# Patient Record
Sex: Male | Born: 1935 | Race: White | Hispanic: No | Smoking: Former smoker
Health system: Southern US, Community
[De-identification: ages and names within clinical notes are randomized; demographics above are authoritative.]

## PROBLEM LIST (undated history)

## (undated) DIAGNOSIS — E785 Hyperlipidemia, unspecified: Secondary | ICD-10-CM

## (undated) DIAGNOSIS — I5022 Chronic systolic (congestive) heart failure: Secondary | ICD-10-CM

## (undated) DIAGNOSIS — I2510 Atherosclerotic heart disease of native coronary artery without angina pectoris: Secondary | ICD-10-CM

## (undated) DIAGNOSIS — I10 Essential (primary) hypertension: Secondary | ICD-10-CM

## (undated) DIAGNOSIS — Z862 Personal history of diseases of the blood and blood-forming organs and certain disorders involving the immune mechanism: Secondary | ICD-10-CM

## (undated) DIAGNOSIS — F0390 Unspecified dementia without behavioral disturbance: Secondary | ICD-10-CM

## (undated) DIAGNOSIS — G4733 Obstructive sleep apnea (adult) (pediatric): Secondary | ICD-10-CM

## (undated) DIAGNOSIS — N400 Enlarged prostate without lower urinary tract symptoms: Secondary | ICD-10-CM

## (undated) HISTORY — DX: Hyperlipidemia, unspecified: E78.5

## (undated) HISTORY — PX: INSERT / REPLACE / REMOVE PACEMAKER: SUR710

## (undated) HISTORY — PX: REFRACTIVE SURGERY: SHX103

## (undated) HISTORY — DX: Atherosclerotic heart disease of native coronary artery without angina pectoris: I25.10

## (undated) HISTORY — DX: Obstructive sleep apnea (adult) (pediatric): G47.33

## (undated) HISTORY — PX: HEMORRHOID SURGERY: SHX153

## (undated) HISTORY — DX: Essential (primary) hypertension: I10

## (undated) HISTORY — DX: Benign prostatic hyperplasia without lower urinary tract symptoms: N40.0

## (undated) HISTORY — DX: Personal history of diseases of the blood and blood-forming organs and certain disorders involving the immune mechanism: Z86.2

---

## 2004-01-09 ENCOUNTER — Other Ambulatory Visit: Payer: Self-pay

## 2005-11-16 ENCOUNTER — Inpatient Hospital Stay: Payer: Self-pay | Admitting: Internal Medicine

## 2005-11-16 ENCOUNTER — Other Ambulatory Visit: Payer: Self-pay

## 2005-12-01 ENCOUNTER — Ambulatory Visit: Payer: Self-pay | Admitting: Internal Medicine

## 2006-01-25 ENCOUNTER — Other Ambulatory Visit: Payer: Self-pay

## 2006-01-25 ENCOUNTER — Inpatient Hospital Stay: Payer: Self-pay | Admitting: Internal Medicine

## 2006-01-27 ENCOUNTER — Other Ambulatory Visit: Payer: Self-pay

## 2006-01-28 ENCOUNTER — Other Ambulatory Visit: Payer: Self-pay

## 2006-01-30 ENCOUNTER — Other Ambulatory Visit: Payer: Self-pay

## 2006-10-12 ENCOUNTER — Ambulatory Visit: Payer: Self-pay | Admitting: Gastroenterology

## 2006-11-20 HISTORY — PX: CORONARY ARTERY BYPASS GRAFT: SHX141

## 2007-04-04 ENCOUNTER — Inpatient Hospital Stay: Payer: Self-pay | Admitting: Internal Medicine

## 2007-04-04 ENCOUNTER — Other Ambulatory Visit: Payer: Self-pay

## 2007-04-05 ENCOUNTER — Other Ambulatory Visit: Payer: Self-pay

## 2007-05-15 ENCOUNTER — Ambulatory Visit: Payer: Self-pay | Admitting: *Deleted

## 2007-05-18 ENCOUNTER — Inpatient Hospital Stay: Payer: Self-pay | Admitting: Internal Medicine

## 2007-05-18 ENCOUNTER — Other Ambulatory Visit: Payer: Self-pay

## 2008-11-10 ENCOUNTER — Emergency Department: Payer: Self-pay | Admitting: Emergency Medicine

## 2009-06-15 ENCOUNTER — Inpatient Hospital Stay: Payer: Self-pay | Admitting: *Deleted

## 2010-09-28 ENCOUNTER — Inpatient Hospital Stay: Payer: Self-pay | Admitting: Internal Medicine

## 2010-10-20 ENCOUNTER — Ambulatory Visit: Payer: Self-pay | Admitting: Family

## 2010-12-22 ENCOUNTER — Ambulatory Visit: Payer: Self-pay | Admitting: Family

## 2011-02-04 ENCOUNTER — Emergency Department: Payer: Self-pay | Admitting: Emergency Medicine

## 2011-02-15 ENCOUNTER — Observation Stay: Payer: Self-pay | Admitting: Internal Medicine

## 2011-03-01 ENCOUNTER — Encounter: Payer: Self-pay | Admitting: Cardiovascular Disease

## 2011-03-01 ENCOUNTER — Observation Stay: Payer: Self-pay | Admitting: Specialist

## 2011-03-02 ENCOUNTER — Inpatient Hospital Stay (HOSPITAL_COMMUNITY)
Admission: AD | Admit: 2011-03-02 | Discharge: 2011-03-14 | DRG: 472 | Disposition: A | Discharge status: Home Health | Payer: Medicare Other | Source: Other Acute Inpatient Hospital | Attending: Internal Medicine | Admitting: Internal Medicine

## 2011-03-02 DIAGNOSIS — G4733 Obstructive sleep apnea (adult) (pediatric): Secondary | ICD-10-CM | POA: Diagnosis present

## 2011-03-02 DIAGNOSIS — Z860100 Personal history of colon polyps, unspecified: Secondary | ICD-10-CM

## 2011-03-02 DIAGNOSIS — I509 Heart failure, unspecified: Secondary | ICD-10-CM | POA: Diagnosis present

## 2011-03-02 DIAGNOSIS — Z880 Allergy status to penicillin: Secondary | ICD-10-CM

## 2011-03-02 DIAGNOSIS — F19921 Other psychoactive substance use, unspecified with intoxication with delirium: Secondary | ICD-10-CM | POA: Diagnosis not present

## 2011-03-02 DIAGNOSIS — Z889 Allergy status to unspecified drugs, medicaments and biological substances status: Secondary | ICD-10-CM

## 2011-03-02 DIAGNOSIS — D509 Iron deficiency anemia, unspecified: Secondary | ICD-10-CM | POA: Diagnosis present

## 2011-03-02 DIAGNOSIS — T4275XA Adverse effect of unspecified antiepileptic and sedative-hypnotic drugs, initial encounter: Secondary | ICD-10-CM | POA: Diagnosis not present

## 2011-03-02 DIAGNOSIS — I2510 Atherosclerotic heart disease of native coronary artery without angina pectoris: Secondary | ICD-10-CM | POA: Diagnosis present

## 2011-03-02 DIAGNOSIS — M4712 Other spondylosis with myelopathy, cervical region: Secondary | ICD-10-CM | POA: Diagnosis present

## 2011-03-02 DIAGNOSIS — T380X5A Adverse effect of glucocorticoids and synthetic analogues, initial encounter: Secondary | ICD-10-CM | POA: Diagnosis not present

## 2011-03-02 DIAGNOSIS — Z9181 History of falling: Secondary | ICD-10-CM

## 2011-03-02 DIAGNOSIS — I10 Essential (primary) hypertension: Secondary | ICD-10-CM | POA: Diagnosis present

## 2011-03-02 DIAGNOSIS — Z951 Presence of aortocoronary bypass graft: Secondary | ICD-10-CM

## 2011-03-02 DIAGNOSIS — N400 Enlarged prostate without lower urinary tract symptoms: Secondary | ICD-10-CM | POA: Diagnosis present

## 2011-03-02 DIAGNOSIS — Z950 Presence of cardiac pacemaker: Secondary | ICD-10-CM

## 2011-03-02 DIAGNOSIS — I5042 Chronic combined systolic (congestive) and diastolic (congestive) heart failure: Secondary | ICD-10-CM | POA: Diagnosis present

## 2011-03-02 DIAGNOSIS — E785 Hyperlipidemia, unspecified: Secondary | ICD-10-CM | POA: Diagnosis present

## 2011-03-02 DIAGNOSIS — M5000 Cervical disc disorder with myelopathy, unspecified cervical region: Principal | ICD-10-CM | POA: Diagnosis present

## 2011-03-02 DIAGNOSIS — M109 Gout, unspecified: Secondary | ICD-10-CM | POA: Diagnosis present

## 2011-03-02 DIAGNOSIS — Z87891 Personal history of nicotine dependence: Secondary | ICD-10-CM

## 2011-03-02 DIAGNOSIS — Z7982 Long term (current) use of aspirin: Secondary | ICD-10-CM

## 2011-03-03 ENCOUNTER — Inpatient Hospital Stay (HOSPITAL_COMMUNITY): Payer: Medicare Other

## 2011-03-03 LAB — COMPREHENSIVE METABOLIC PANEL
ALT: 20 U/L (ref 0–53)
AST: 32 U/L (ref 0–37)
Alkaline Phosphatase: 102 U/L (ref 39–117)
CO2: 25 mEq/L (ref 19–32)
Chloride: 107 mEq/L (ref 96–112)
GFR calc Af Amer: >60 mL/min (ref >60)
GFR calc non Af Amer: >60 mL/min (ref >60)
Potassium: 3.9 mEq/L (ref 3.5–5.1)
Sodium: 140 mEq/L (ref 135–145)
Total Bilirubin: 1.3 mg/dL — ABNORMAL HIGH (ref 0.3–1.2)

## 2011-03-04 LAB — DIFFERENTIAL
Eosinophils Absolute: 0.1 10*3/uL (ref 0.0–0.7)
Eosinophils Relative: 2 % (ref 0–5)
Lymphs Abs: 1.1 10*3/uL (ref 0.7–4.0)
Monocytes Absolute: 1.2 10*3/uL — ABNORMAL HIGH (ref 0.1–1.0)
Monocytes Relative: 19 % — ABNORMAL HIGH (ref 3–12)

## 2011-03-04 LAB — CBC
HCT: 43.5 % (ref 39.0–52.0)
MCH: 30 pg (ref 26.0–34.0)
MCV: 89.5 fL (ref 78.0–100.0)
Platelets: 141 10*3/uL — ABNORMAL LOW (ref 150–400)
RDW: 15.7 % — ABNORMAL HIGH (ref 11.5–15.5)

## 2011-03-04 LAB — BASIC METABOLIC PANEL
BUN: 19 mg/dL (ref 6–23)
Creatinine, Ser: 1.13 mg/dL (ref 0.4–1.5)
GFR calc non Af Amer: >60 mL/min (ref >60)
Glucose, Bld: 99 mg/dL (ref 70–99)

## 2011-03-06 ENCOUNTER — Inpatient Hospital Stay (HOSPITAL_COMMUNITY): Payer: Medicare Other

## 2011-03-06 LAB — BASIC METABOLIC PANEL
BUN: 22 mg/dL (ref 6–23)
CO2: 26 mEq/L (ref 19–32)
Calcium: 9 mg/dL (ref 8.4–10.5)
Creatinine, Ser: 1.26 mg/dL (ref 0.4–1.5)
GFR calc Af Amer: >60 mL/min (ref >60)

## 2011-03-06 MED ORDER — IOHEXOL 300 MG/ML  SOLN
10.0000 mL | Freq: Once | INTRAMUSCULAR | Status: AC | PRN
  Administered 2011-03-06: 10 mL via INTRATHECAL

## 2011-03-07 ENCOUNTER — Inpatient Hospital Stay (HOSPITAL_COMMUNITY): Payer: Medicare Other

## 2011-03-07 LAB — PROTIME-INR: Prothrombin Time: 14.1 seconds (ref 11.6–15.2)

## 2011-03-07 LAB — BASIC METABOLIC PANEL
BUN: 30 mg/dL — ABNORMAL HIGH (ref 6–23)
CO2: 25 mEq/L (ref 19–32)
Calcium: 9.2 mg/dL (ref 8.4–10.5)
Creatinine, Ser: 1.25 mg/dL (ref 0.4–1.5)
GFR calc non Af Amer: 56 mL/min — ABNORMAL LOW (ref >60)
Glucose, Bld: 105 mg/dL — ABNORMAL HIGH (ref 70–99)
Sodium: 138 mEq/L (ref 135–145)

## 2011-03-07 LAB — CBC
MCH: 30.4 pg (ref 26.0–34.0)
Platelets: 172 10*3/uL (ref 150–400)
RBC: 5.23 MIL/uL (ref 4.22–5.81)
RDW: 15.4 % (ref 11.5–15.5)

## 2011-03-08 ENCOUNTER — Inpatient Hospital Stay (HOSPITAL_COMMUNITY): Payer: Medicare Other

## 2011-03-08 LAB — CBC
HCT: 45.3 % (ref 39.0–52.0)
MCH: 29.9 pg (ref 26.0–34.0)
MCHC: 33.8 g/dL (ref 30.0–36.0)
MCV: 88.5 fL (ref 78.0–100.0)
Platelets: 169 10*3/uL (ref 150–400)
RDW: 15.3 % (ref 11.5–15.5)
WBC: 8.2 10*3/uL (ref 4.0–10.5)

## 2011-03-08 LAB — BASIC METABOLIC PANEL
BUN: 31 mg/dL — ABNORMAL HIGH (ref 6–23)
Calcium: 9.1 mg/dL (ref 8.4–10.5)
Creatinine, Ser: 1.14 mg/dL (ref 0.4–1.5)
GFR calc non Af Amer: >60 mL/min (ref >60)
Glucose, Bld: 100 mg/dL — ABNORMAL HIGH (ref 70–99)

## 2011-03-09 DIAGNOSIS — M4712 Other spondylosis with myelopathy, cervical region: Secondary | ICD-10-CM

## 2011-03-10 LAB — BASIC METABOLIC PANEL
CO2: 28 mEq/L (ref 19–32)
Calcium: 9.4 mg/dL (ref 8.4–10.5)
Creatinine, Ser: 1.11 mg/dL (ref 0.4–1.5)
GFR calc Af Amer: >60 mL/min (ref >60)
GFR calc non Af Amer: >60 mL/min (ref >60)

## 2011-03-10 LAB — CBC
MCH: 29.6 pg (ref 26.0–34.0)
MCHC: 33.6 g/dL (ref 30.0–36.0)
Platelets: 191 10*3/uL (ref 150–400)
RDW: 15.1 % (ref 11.5–15.5)

## 2011-03-12 ENCOUNTER — Inpatient Hospital Stay (HOSPITAL_COMMUNITY): Payer: Medicare Other

## 2011-03-13 LAB — BASIC METABOLIC PANEL
Calcium: 9.4 mg/dL (ref 8.4–10.5)
GFR calc Af Amer: >60 mL/min (ref >60)
GFR calc non Af Amer: >60 mL/min (ref >60)
Glucose, Bld: 107 mg/dL — ABNORMAL HIGH (ref 70–99)
Sodium: 131 mEq/L — ABNORMAL LOW (ref 135–145)

## 2011-03-13 NOTE — H&P (Signed)
NAME:  Gregory [name], Gregory [name] NO.:  [id number]  MEDICAL RECORD NO.:  [id number]           PATIENT TYPE:  I  LOCATION:  5501                         FACILITY:  [facility]  PHYSICIAN:  Mariea Stable, MD   DATE OF BIRTH:  03/06/1931  DATE OF ADMISSION:  03/02/2011 DATE OF DISCHARGE:                             HISTORY & PHYSICAL   PRIMARY CARE PHYSICIAN:  [facility].  CARDIOLOGIST:  Dr. Dorothyann Peng.  CHIEF COMPLAINT:  Difficulty walking with frequent falls and difficulty with balance.  HISTORY OF PRESENT ILLNESS:  Gregory [name] is a 77 year old right-handed man with past medical history significant for hypertension, hyperlipidemia, tobacco use, coronary artery disease status post CABG, and mild congestive heart failure who is transferred from [facility] for gait disturbance and frequent falls.  The patient was admitted to [facility] on February 14, 2011, through February 16, 2011, for of presyncope and falls, thought to be secondary to his beta-blocker use.  He was admitted again on March 01, 2011, again for difficulty walking with frequent falls and difficulty with balance.  Apparently, his symptoms have been worsening over the last 4-6 weeks.  His gait has become unstable to the point where he is now falling on a daily basis secondary to loss of balance with his legs giving out.  He also reports some numbness and tingling in bilateral hands as well as lower extremities.  Of note, he denies any chest pain, palpitation, orthopnea, bowel or bladder problems.  The patient has had an extensive workup including a 2-D echocardiogram, catheterization, carotid ultrasound, B12 and folate levels, TSH, and CT scan of the head which have all been negative.  A neurosurgical evaluation was requested and Dr. Kemper Durie did a consultation and recommended a transfer to [facility] as his impression was that of low cervical myelopathy and radiculopathy.  Recommendation for  cervical spine CT along with pyelogram to further evaluate the possible myelopathy.  The patient was then transferred to [facility] and admitted to the [facility] with neurosurgical consultation to follow.  PAST MEDICAL HISTORY: 1. Chronic systolic congestive heart failure with an EF of 45-50% and     diastolic dysfunction. 2. History of anemia secondary to multiple AVMs leading to iron     deficiency with EGD in 2011 showing gastritis and a colonoscopy     which had shown hemorrhoids and polyps. 3. Possible sleep apnea. 4. History of coronary artery disease status post CABG in 2008.  Most     recent catheterization in November 2011 showed stable coronary     artery disease with no evidence of new occlusions. 5. Hypertension. 6. Hyperlipidemia. 7. BPH. 8. History of syncope status post pacemaker placement.  PAST SURGICAL HISTORY: 1. CABG in 2008 with catheterization in November 2011 per above. 2. Pacemaker placement. 3. Laser eye surgery. 4. Hemorrhoidectomy.  SOCIAL HISTORY:  The patient lives alone in a trailer next to his son. The patient quit smoking apparently in 1989 but smoked for more than 30 years prior to that.  He apparently stopped drinking approximately 30 years ago and denies any drug use.  The patient  is widowed and states that either of his sons are to be contacted in case of need for medical decision making.  Sons' names are Arlys John and Genevie Cheshire and their cell phone numbers are [phone] and [phone] respectively.  FAMILY HISTORY:  Sister with coronary artery disease.  Mother had gastric cancer.  Brother had lung cancer.  MEDICATIONS: 1. Aspirin 81 mg p.o. daily. 2. Colace 100 mg p.o. b.i.d. 3. Magnesium oxide 400 mg p.o. daily. 4. Lovastatin 40 mg p.o. daily. 5. Senna 8.5 mg p.o. daily. 6. Iron sulfate 325 mg p.o. daily. 7. Lopressor 25 mg p.o. b.i.d. 8. Flomax 0.4 mg p.o. daily. 9. Tramadol 50 mg p.o. daily p.r.n. 10.Omeprazole 20 mg  p.o. daily. 11.Altace 10 mg p.o. b.i.d. 12.Lasix 20 mg p.o. every other day.  ALLERGIES:  PENICILLIN and CORTISONE which cause rash.  REVIEW OF SYSTEMS:  As per HPI.  Others are reviewed and negative.  PHYSICAL EXAMINATION:  VITAL SIGNS:  Temperature 97.9, blood pressure 122/73, pulse of 58, respirations 19, oxygen saturation 93% on room air. GENERAL:  This is an elderly man, lying in bed in no acute distress. HEENT:  Head is normocephalic, atraumatic.  Pupils equally round and reactive to light.  Extraocular movements are intact although there is a strabismus present.  Mucous membranes are moist.  There are no oral pharyngeal lesions. NECK:  Supple.  There is no JVD.  There is no thyromegaly.  There is no carotid bruits. LUNGS:  Good air movement bilaterally and are clear to auscultation. HEART:  Normal S1 and S2 with a regular rate and rhythm.  There is a grade 1-2/6 systolic murmur.  There are no gallops or rubs. ABDOMEN:  Positive bowel sounds, soft, nontender, nondistended. EXTREMITIES:  There is no edema. NEUROLOGIC:  The patient is awake, alert, and oriented x3.  Cranial nerves are grossly intact. Motor:  Approximately 4/5 in the left hand with 5-/5 at the right. There is approximately 4+/5 at the ankles bilaterally.  Sensation is grossly intact to light touch.  LABORATORY DATA:  WBC 15.2, hemoglobin 14.0, platelets 133.  Sodium 143, potassium 3.9, chloride 111, bicarb 22, BUN 24, creatinine 1.04, calcium 8.6.  LFTs are within normal limits.  TSH of 1.4.  B12 737.  Folic acid 7.5.  IMAGING: 1. CT head without contrast.  Impression:  Stable CT of the brain with     evidence of atrophy and chronic small vessel ischemic disease.     Stable dystrophic left frontal cortical calcifications are present. 2. Chest x-ray.  Impression:  Increased interstitial markings which     may represent a component of pulmonary vascular congestion. 3. CT cervical spine.  Impression: 4. No  evidence of compression fracture of the cervical spine. 5. There is a disk space narrowing and annular disk bulging at C6-C7     but no high-grade canal stenosis noted. 6. Variable degrees of mild neural foraminal encroachment at several     levels.  ASSESSMENT AND PLAN: 1. Abnormal gait with frequent falls and likely a low cervical     myelopathy with radiculopathy.  The patient will be admitted to a     med/surg floor.  We will go ahead and order routine labs for the     morning.  We will obtain a neurosurgical consultation from Dr.     Kemper Durie.  The patient is to have the CT of the cervical spine     reviewed along with possible cervical spine myelogram. 2. History of chronic  congestive heart failure, mixed systolic and     diastolic, with ejection fraction of 45-50%.  The patient is     currently euvolemic.  We will continue his home diuretics,     angiotensin-converting enzyme inhibitor, and beta-blocker. 3. History of coronary artery disease status post coronary artery     bypass graft.  The patient is currently asymptomatic.  We will     continue with his home dose of aspirin, beta-blocker, and statin. 4. Hypertension.  We will continue with home antihypertensive. 5. Hyperlipidemia.  Continue with statin therapy. 6. Benign prostatic hypertrophy.  We will continue with his alpha-     blocker at home dose.     Mariea Stable, MD     MA/MEDQ  D:  03/02/2011  T:  03/02/2011  Job:  [id number]  cc:   [facility] Dr. Dorothyann Peng  Electronically Signed by Mariea Stable MD on 03/13/2011 10:41:14 AM

## 2011-03-20 NOTE — Consult Note (Signed)
NAME:  Gregory [name], VITELLI NO.:  [id number]  MEDICAL RECORD NO.:  [id number]           PATIENT TYPE:  I  LOCATION:  5501                         FACILITY:  [facility]  PHYSICIAN:  Tia Alert, MD     DATE OF BIRTH:  03/06/1931  DATE OF CONSULTATION:  03/05/2011 DATE OF DISCHARGE:                                CONSULTATION   CHIEF COMPLAINT:  Difficulty walking, suspected myelopathy.  BRIEF HISTORY OF PRESENT ILLNESS:  [name] was transferred from an outside hospital to the Hospitalist Service regarding difficulty with gait.  He was seen by an outside neurologist who felt he probably had a cervical myelopathy.  He was transferred from [facility], there they had a CT scan of the cervical spine which suggested some changes at C6-7 but we do not have this films for our review.  He did report some numbness and weakness in his hands.  He reports difficulty with gait.  He has had multiple falls over the last 6 weeks, has become progressively worse over time.  CT myelogram of the cervical, thoracic and lumbar spine has been ordered but has not been done at this point.  PAST MEDICAL HISTORY: 1. CHF with expected EF of 45-50% with diastolic dysfunction. 2. Anemia. 3. Sleep apnea. 4. Coronary artery disease status post CABG. 5. Hypertension. 6. Dyslipidemia. 7. BPH. 8. Syncope. 9. Status post pacemaker placement. 10.Laser eye surgery. 11.Hemorrhoidectomy.  MEDICATIONS:  Aspirin, Colace, magnesium, lovastatin, senna, iron, Lopressor, Flomax, tramadol, Prilosec, Altace, and Lasix.  ALLERGIES:  PENICILLIN and CORTISONE which cause rash.  REVIEW OF SYSTEMS:  As above.  SOCIAL HISTORY:  Lives alone in a trailer next to his son and quit smoking apparently in 1989 but smoked for more than 30 years prior to this.  Stopped drinking about 30 years ago and denies any drug use.  PHYSICAL EXAMINATION:  VITAL SIGNS:  He is afebrile.  Blood  pressure 122/73, pulse 60, respirations 16. GENERAL:  Pleasant cooperative man sitting in a chair. HEENT:  Normocephalic, atraumatic.  Extraocular movements are decreased in the right eye with lateral gaze and there is some dystrophia. NECK:  Supple. HEART:  Regular rate and rhythm. EXTREMITIES:  No obvious deformities.  There are multiple healing abrasions on the knees bilaterally. NEUROLOGIC:  He is awake.  He is pleasant.  He is interactive.  There is no aphasia.  He has good attention span.  He is conversive.  No facial asymmetry.  His strength is 4-/5 in the hand grips, 4/5 in the upper extremities with some discoordination of movement.  He has a positive Hoffman sign bilaterally.  Reflexes are not overtly brisk.  Lower extremities seem to have good strength.  Gait is not tested at this point as he does not have an assisted device to help walk and I did not want him to suffer a fall.  Sensation appears to be grossly intact.  ASSESSMENT/PLAN:  Would like to get a CT myelogram of cervical, thoracic and lumbar spine to evaluate the cause of his difficulty with ambulation.  I certainly suspect that he is myelopathic but the report  of the CT scan of cervical spine does not appear to be overly impressive though myelogram has been ordered.  I think it will be done tomorrow.  I will make further treatment recommendations once the myelogram is done.     Tia Alert, MD     DSJ/MEDQ  D:  03/05/2011  T:  03/05/2011  Job:  [id number]  Electronically Signed by Marikay Alar MD on 03/20/2011 10:46:51 AM

## 2011-03-20 NOTE — Op Note (Signed)
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  PROCEDURES: 1. Partial corpectomy at C5 and C6 followed by decompressive anterior     cervical diskectomy C5-6 for central canal decompression. 2. Anterior cervical arthrodesis C5-6 utilizing a 12-mm PEEK interbody     cage packed with local autograft and Actifuse putty. 3. Anterior cervical plating C5-6 utilizing the DePuy spine plate.SURGEON:  Tia Alert, MD  ASSISTANT:  Donalee Citrin, MD  ANESTHESIA:  General endotracheal.  COMPLICATIONS:  None apparent.  INDICATIONS FOR PROCEDURE:  Mr. Seufert is a very pleasant 77 year old gentleman who was admitted with difficulty ambulation and weakness and numbness in his hands.  He was found to have myelopathy on exam.  He had a CT myelogram which showed a large cervical disk herniation at C5-6 with severe compression of the cervical cord.  I recommended ACDF with plating at C5-6.  He understood the risks, benefits, and expected outcome, and wished to proceed.  DESCRIPTION OF PROCEDURE:  The patient was taken to the operating room after induction of adequate generalized endotracheal anesthesia.  He was placed in the supine position on the operating table.  The glide scope was used to intubate him.  I was present for the intubation.  There was no significant neck extension during the intubation.  He was then positioned carefully.  His right  anterior cervical region was shaved, cleaned with Hibiclens, prepped with DuraPrep, and then draped in usual sterile fashion.  A 5 mL of local anesthesia was injected and a transverse incision was made to the right of midline and carried down to the platysma.  The placenta was elevated and opened and undermined with Metzenbaum scissors, and then dissected in plane medial to the sternocleidomastoid muscle and internal carotid artery and lateral to the trachea and esophagus to expose C5-6.  Intraoperative fluoroscopy confirmed my level and then the longus colli muscles were taken down and the shadow line retractors were placed under this to expose C5-6.  The annulus was incised and the initial diskectomy was done with pituitary rongeurs and curved curettes.  The disk was very degenerated.  I used the high-speed drill to drill the endplates down to the level of posterior longitudinal ligament.  I widened the disk space to a height of 12 mm.  Because of the size of the disk herniation, we pushed the posterior longitudinal ligament away from the vertebral bodies.  I knew I had to do a very generous decompression at this level; therefore, we felt that partial corpectomy was going to be in order.  Therefore, I drilled away significant amount of C5-C6  and I was able to then bring in the operating microscope, which we used for microscopic dissection in the remainder of the case.  The posterior longitudinal ligament was opened.  I then removed by removing a large midline fragment.  The dura then relaxed.  I was able to undercut C5 and C6.  The dura was no longer pushed severely away from this and seemed to be full all the way across. Once we undercut the vertebral bodies, we could pass the nerve hook easily circumferentially, so therefore by visualization and by palpation, we felt an adequate decompression of the canal.  We measured interspace to be 12 mm and used a corresponding PEEK interbody  cage packed with local autograft and Actifuse putty, and tapped this into position at C5-6.  We then used a DePuy spine plate and placed two 14-mm variable angle screws in the bodies of C5 and C6.  I then locked these into position.  We irrigated with saline solution containing bacitracin, dried all bleeding points with bipolar cautery and with Surgifoam, and once meticulous hemostasis was achieved, we closed the platysma with 3-0 Vicryl closing the subcuticular tissue with 0 Vicryl and closed the skin with Benzoin and Steri-Strips.  The drapes were removed.  Sterile dressing was applied.  The patient was awakened from general anesthesia and transferred to recovery room in stable condition.  At the end of the procedure, all sponge, needle, and instrument counts were correct.     Tia Alert, MD     DSJ/MEDQ  D:  03/08/2011  T:  03/09/2011  Job:  [id number]  Electronically Signed by Marikay Alar MD on 03/20/2011 10:46:56 AM

## 2011-03-20 NOTE — Consult Note (Signed)
  NAME:  Gregory, [name] NO.:  [id number]  MEDICAL RECORD NO.:  [id number]           PATIENT TYPE:  I  LOCATION:  5501                         FACILITY:  [facility]  PHYSICIAN:  Tia Alert, MD     DATE OF BIRTH:  03/06/1931  DATE OF CONSULTATION:  03/07/2011 DATE OF DISCHARGE:                                CONSULTATION   Gregory [name] had his CT myelogram done yesterday and this shows multilevel spondylosis, but the most significant finding is that of a large central disk herniation at C5-6 with severe compression of the cervical spinal cord at that level, left greater than right.  Gregory [name] continues to complain of difficulty with gait, he complains of cramping in his left lower extremity and some dysfunction and numbness in his hands.  Physical exam is unchanged except today he has more cramping in his leg during my talk with him.  I explained the findings on the myelogram to him.  I think this is the cause of his cervical myelopathy and what I have recommended is a decompressive surgery in the form of an anterior cervical diskectomy fusion and plating at C5-6.  I have tried to describe this surgery to him as best I could in detail.  I have described typical outcomes and recovery times.  He understands that the goal of this surgery is not necessarily to make him better, but to stop progression of myelopathy; however, I did explain to him that a certain number of people do get some improvement from the surgery in their gait and the use of their hands.  He will likely need some type of rehab after the surgery to work on his gait because he has had trouble with his gait for about 3 months.  He understands the risks of surgery to include, but limits to bleeding, infection, nerve root injury, spinal cord injury, CSF leak, numbness, weakness, paralysis, pseudoarthrosis, hardware failure, esophageal injury, tracheal injury, carotid artery injury, vertebral artery  injury, recurrent laryngeal nerve injury, lack of relief of pain, worsening pain, worsening neurologic deficits, and anesthesia risks.  He understands because of the critical severity of his spinal stenosis and the cord compression that the risk of spinal cord injury with this surgery is higher than the typical ACDF.  He understands all of these things and states, "I have to do something" and he is willing to move forward with the surgery and we will try to do this tomorrow, so he will be made n.p.o. after midnight, we are getting consented for the surgery.     Tia Alert, MD     DSJ/MEDQ  D:  03/07/2011  T:  03/07/2011  Job:  [id number]  Electronically Signed by Marikay Alar MD on 03/20/2011 10:46:54 AM

## 2011-03-29 NOTE — Discharge Summary (Signed)
NAME:  Gregory [name], Gregory [name] NO.:  [id number]  MEDICAL RECORD NO.:  [id number]           PATIENT TYPE:  I  LOCATION:  3029                         FACILITY:  [facility]  PHYSICIAN:  Calvert Cantor, M.D.     DATE OF BIRTH:  03/06/1931  DATE OF ADMISSION:  03/02/2011 DATE OF DISCHARGE:  03/14/2011                              DISCHARGE SUMMARY   PRIMARY CARE PHYSICIAN:  At the [facility].  CARDIOLOGIST:  Dr. Dorothyann Peng  PRESENTING COMPLAINT:  Difficulty walking with frequent falls and numbness and tingling of upper extremities.  DISCHARGE DIAGNOSES: 1. 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 2. Delirium during hospital stay that is secondary to steroids or     narcotics. 3. Gout of left knee occurring during hospital stay.  PAST MEDICAL HISTORY: 1. Chronic systolic heart failure with an EF of 45-50% and chronic     diastolic heart failure. 2. Anemia secondary to multiple AVMs leading to iron deficiency. 3. Possible sleep apnea. 4. Coronary artery disease status post CABG in 2008, most recent cath     in November 2011 revealed stable coronary artery disease with no     new occlusions. 5. Hypertension. 6. Hyperlipidemia. 7. BPH. 8. History of syncope status post permanent pacemaker.  DISCHARGE MEDICATIONS:  New medication is, 1. Allopurinol 100 mg daily. 2. Ibuprofen 600 mg every 8 hours for gout and moderate pain.  Continue the following home meds, 1. Altace 10 mg daily. 2. Aspirin 81 mg daily. 3. Ferrous sulfate over-the-counter 1 tablet daily. 4. Klor-Con 20 mEq daily. 5. Lasix 40 mg 1-1/2 tablets daily. 6. Lipitor 40 mg 1 tablet daily. 7. Magnesium oxide 1 tablet daily. 8. Metoprolol tartrate 25 mg 1 tablet twice a day. 9. Nitroglycerin translingual spray every 5 minutes as needed up to 3     doses. 10.Prilosec 20 mg daily. 11.Senna OTC 1 tablet daily as needed for  constipation. 12.Tamsulosin 0.4 mg daily. 13.Tramadol 50 mg every 8 hours as needed for pain.  CONSULTANTS:  Tia Alert, MD, Neurosurgery.  RADIOLOGICAL IMAGING DATA: 1. Cervical myelogram performed on March 06, 2011, revealed high-grade     stenosis at C5-C6 with a probable large disk protrusion at this     level, mild disk bulging at L3-L4 and L4-L5. 2. CT C-spine with contrast on March 06, 2011, revealed multilevel     disk and facet degeneration most significant finding is at extruded     disk fragment of C5-C6 central and left-sided with compression of     the spinal cord. 3. CT lumbar spine with contrast on March 06, 2011, revealed lumbar     disk and facet generation with mild spinal stenosis at L3-L4 and L4-     L5.  No focal disk protrusion is identified. 4. Chest x-ray two-view on March 07, 2011, revealed mild chronic     interstitial lung disease/chronic bronchitis, suspect scarring at     left base and cardiomegaly. 5. C-spine one-view on March 08, 2011, revealed C5-C6 anterior     cervical plate. 6.  X-ray of the left hip complete on March 08, 2011, was negative for     fracture.  There was soft tissue calcification compatible with     calcified tendonitis. 7. X-ray of the left knee complete on March 12, 2011, revealed soft     tissue swelling anterior to the patella compatible with bursitis. 8. X-ray of the tibia-fibula on March 12, 2011, did not reveal any     acute findings.  HOSPITAL COURSE:  This is a 77 year old male who was admitted with complaints of frequent falls and also noted to have numbness and tingling in bilateral upper extremities.  Above-mentioned studies were done, which revealed cord compression.  Neurosurgical consult was requested.  Dr. Yetta Barre took him to the OR and performed surgery on March 08, 2011.  PROCEDURES:  Are as follows, 1. Partial corpectomy at C5 and C6 followed by decompressive anterior     cervical diskectomy at C5-C6 for  central canal decompression 2. Anterior cervical arthrodesis, C5-C6 utilizing a 12-mm peak     interbody cage packed with local autograft and Actifuse putty. 3. Anterior cervical plating, C5-C6 utilizing the DePuy spine plate.  No significant complications were noted after the surgery.  The patient immediately regained sensation in both arms.  He did have some residual left leg weakness and was initially recommended to go to rehab facility.  However, soon after that he developed redness and swelling of the left knee, which was consistent with gout.  He was then treated with colchicine relieving his gout and his pain in the knee.  Today, his strength in his lower extremities is 4/5 bilaterally.  The patient has declined a rehab facility.  At this point, it appears that he is safe for home health PT and OT, in addition a social work consult will also be requested to ensure that home safety is satisfactory.  The patient had an episode of delirium soon after surgery.  This lasted 2-3 days.  He did require a sitter at the time.  He does not have any underlying dementia and according to the sons, he has never had an episode of delirium in the past.  At that time, he was on prednisone, Flexeril, and Vicodin, all 3 of which were discontinued immediately with improvement noted by day 3.  At this point, the patient is back to his baseline mental status.  PHYSICAL EXAMINATION:  On physical exam today, LUNGS:  Clear bilaterally. HEART:  Regular rate and rhythm.  No murmurs. ABDOMEN:  Soft, nontender, nondistended.  Bowel sounds positive.  No organomegaly. EXTREMITIES:  No cyanosis, clubbing, or edema.  CONDITION ON DISCHARGE:  Stable.  FOLLOWUP INSTRUCTIONS:  He does need to follow up with Dr. Yetta Barre.  In addition, he is recommended to follow up with the [facility] in 2-3 weeks.  TIME ON DISCHARGE TODAY:  60 minutes.     Calvert Cantor, M.D.     SR/MEDQ  D:  03/14/2011  T:   03/14/2011  Job:  [id number]  cc:   Tia Alert, MD [facility]  Electronically Signed by Calvert Cantor M.D. on 03/29/2011 11:11:25 PM

## 2011-03-29 NOTE — Group Therapy Note (Signed)
NAME:  Gregory, [name] NO.:  [id number]  MEDICAL RECORD NO.:  [id number]           PATIENT TYPE:  I  LOCATION:  5501                         FACILITY:  [facility]  PHYSICIAN:  Ramiro Harvest, MD    DATE OF BIRTH:  03/06/1931                                PROGRESS NOTE   CURRENT DIAGNOSES: 1. Abnormal gait and frequent falls likely secondary to cervical     myelopathy with radiculopathy. 2. History of systolic and diastolic heart failure compensated. 3. Hypertension, stable. 4. Coronary artery disease status post coronary artery bypass     grafting, stable. 5. Benign prostatic hypertrophy, stable. 6. History of anemia secondary to multiple arteriovenous malformations     leading to iron deficiency with EGD in 2011 showing gastritis and     colonoscopy, which had shown hemorrhoids and polyps. 7. Probable sleep apnea. 8. History of coronary artery disease status post coronary artery     bypass grafting in 2008.  Most recent catheterization in November     2011 showed stable coronary artery disease with no new evidence of     new occlusions. 9. Hyperlipidemia. 10.History of syncope status post permanent pacemaker placement.     Status post laser eye surgery.  Status post hemorrhoidectomy.  Disposition and follow-up will be dictated per discharging physician.  BRIEF ADMISSION HISTORY AND PHYSICAL: [name] is a 78-year right-handed gentleman with past medical history significant for hypertension, hyperlipidemia, tobacco abuse, coronary artery disease status post CABG, and mild CHF who was transferred from [facility] for gait disturbance and frequent falls.  The patient was admitted to [facility] on February 14, 2011, through February 16, 2011, for presyncope and fall thought to be secondary to his beta-blocker use.  He was admitted again on March 01, 2011, again for difficulty walking and frequent falls and  difficulty with balance.  Apparently, symptoms had worsened over the past 4-6 weeks.  Gait had become unstable to the point where he was now falling on a daily basis secondary to loss of balance with legs giving out.  The patient also reported some numbness and tingling in bilateral hands as well as his lower extremities.  Of note, the patient denied any chest pain or palpitations.  No orthopnea, no bowel or bladder problems.  The patient had extensive workup including a 2-D echo, catheterization, carotid ultrasound, B12, folate levels, TSH, and CT scan of the head which had been negative.  Neurosurgical evaluation was requested.  Dr. Genevie Cheshire did a consultation and recommended transfer to [facility] as the impression was that the patient had a low cervical myelopathy and radiculopathy.  RECOMMENDATIONS: For cervical spine CT along with myelogram for further evaluation for possible myelopathy.  Subsequently, the patient was transferred to [facility].  For the rest of hospitalization, please see H and P dictated by Dr. Onalee Hua of job number [id number].  HOSPITAL COURSE: Abnormal gait with frequent falls, unlikely low cervical myelopathy and radiculopathy.  The patient was admitted for this, a Neurosurgical consultation was obtained.  The patient was seen in consultation by Dr. Yetta Barre on March 05, 2011, and at that time, it was felt that the patient's symptoms were likely myelopathic in nature and as such a CT myelogram of the C-spine, L-spine, and T-spine were obtained with results as stated.  CT myelogram did show multilevel disk and facet degeneration.  Significant finding showed a large extruded disk fragment at C5-C6, central and left-sided with compression of the spinal cord. The patient is currently awaiting surgical repair with anterior cervical diskectomy fusion and plating of C5-C6 per Neurosurgery which is to be done on March 08, 2011, per Dr. Yetta Barre.  The patient is currently  in stable condition.  The rest of the patient's chronic issues have been stable.  The patient is currently awaiting surgery and you have PT/OT consultations done during this hospitalization.  A neurosurgical consultation was done.  The patient was seen by Dr. Yetta Barre on March 05, 2011.  PROCEDURES PERFORMED DURING THIS HOSPITALIZATION: 1. A myelogram of the C, T, and L-spine were done that showed high-     grade stenosis at C5-C6, probable large disk protrusion at this     level, mild disk bulging at L3-L4 and L4-L5, multilevel disk and     facet degeneration.  Most significant finding is a large extruded     disk fragment at C5-C6 central and left-sided with compression of     the spinal cord, lumbar disk and facet degeneration causing mild     spinal stenosis at L3-L4 and L4-L5.  No focal disk protrusion is     identified. 2. Chest x-ray done on March 07, 2011, showed mild chronic     interstitial lung disease, chronic bronchitis.  The suspect     scarring in the left base and cardiomegaly.  It has been a pleasure taking care of [name].     Ramiro Harvest, MD     DT/MEDQ  D:  03/07/2011  T:  03/07/2011  Job:  [id number]  Electronically Signed by Ramiro Harvest MD on 03/29/2011 03:06:33 PM

## 2011-06-09 ENCOUNTER — Ambulatory Visit: Payer: Self-pay | Admitting: Family Medicine

## 2012-08-04 ENCOUNTER — Inpatient Hospital Stay: Payer: Self-pay | Admitting: Internal Medicine

## 2012-08-04 LAB — CBC
HGB: 17.5 g/dL (ref 13.0–18.0)
MCH: 33.2 pg (ref 26.0–34.0)
MCHC: 35.2 g/dL (ref 32.0–36.0)
Platelet: 220 10*3/uL (ref 150–440)
RBC: 5.26 10*6/uL (ref 4.40–5.90)

## 2012-08-04 LAB — PROTIME-INR: Prothrombin Time: 13.4 secs (ref 11.5–14.7)

## 2012-08-04 LAB — TROPONIN I
Troponin-I: 0.03 ng/mL
Troponin-I: 0.04 ng/mL

## 2012-08-04 LAB — COMPREHENSIVE METABOLIC PANEL
Alkaline Phosphatase: 194 U/L — ABNORMAL HIGH (ref 50–136)
Chloride: 104 mmol/L (ref 98–107)
EGFR (African American): 41 — ABNORMAL LOW
EGFR (Non-African Amer.): 35 — ABNORMAL LOW
SGOT(AST): 171 U/L — ABNORMAL HIGH (ref 15–37)
SGPT (ALT): 75 U/L (ref 12–78)
Total Protein: 7.8 g/dL (ref 6.4–8.2)

## 2012-08-04 LAB — BASIC METABOLIC PANEL
Calcium, Total: 9.3 mg/dL (ref 8.5–10.1)
Chloride: 104 mmol/L (ref 98–107)
Co2: 26 mmol/L (ref 21–32)
Potassium: 4.8 mmol/L (ref 3.5–5.1)
Sodium: 137 mmol/L (ref 136–145)

## 2012-08-04 LAB — HEPATIC FUNCTION PANEL A (ARMC)
Albumin: 3.2 g/dL — ABNORMAL LOW (ref 3.4–5.0)
Alkaline Phosphatase: 195 U/L — ABNORMAL HIGH (ref 50–136)
Bilirubin, Direct: 3.7 mg/dL — ABNORMAL HIGH (ref 0.00–0.20)
Bilirubin,Total: 5.1 mg/dL — ABNORMAL HIGH (ref 0.2–1.0)
Total Protein: 6.9 g/dL (ref 6.4–8.2)

## 2012-08-04 LAB — CK TOTAL AND CKMB (NOT AT ARMC): CK, Total: 156 U/L (ref 35–232)

## 2012-08-04 LAB — LIPASE, BLOOD: Lipase: >3000 U/L (ref 73–393)

## 2012-08-05 LAB — CBC WITH DIFFERENTIAL/PLATELET
Basophil %: 0.8 %
Eosinophil #: 0 10*3/uL (ref 0.0–0.7)
HCT: 41 % (ref 40.0–52.0)
HGB: 14.1 g/dL (ref 13.0–18.0)
Lymphocyte #: 0.4 10*3/uL — ABNORMAL LOW (ref 1.0–3.6)
Lymphocyte %: 6.6 %
MCH: 32.3 pg (ref 26.0–34.0)
MCHC: 34.3 g/dL (ref 32.0–36.0)
MCV: 94 fL (ref 80–100)
Monocyte #: 0.6 x10 3/mm (ref 0.2–1.0)
Neutrophil #: 5.6 10*3/uL (ref 1.4–6.5)

## 2012-08-05 LAB — COMPREHENSIVE METABOLIC PANEL
Alkaline Phosphatase: 180 U/L — ABNORMAL HIGH (ref 50–136)
Bilirubin,Total: 7.2 mg/dL — ABNORMAL HIGH (ref 0.2–1.0)
Co2: 24 mmol/L (ref 21–32)
Creatinine: 1.25 mg/dL (ref 0.60–1.30)
EGFR (Non-African Amer.): 55 — ABNORMAL LOW
SGPT (ALT): 89 U/L — ABNORMAL HIGH (ref 12–78)

## 2012-08-05 LAB — LIPID PANEL
Cholesterol: 114 mg/dL (ref 0–200)
HDL Cholesterol: 13 mg/dL — ABNORMAL LOW (ref 40–60)
Ldl Cholesterol, Calc: 72 mg/dL (ref 0–100)
Triglycerides: 143 mg/dL (ref 0–200)

## 2012-08-06 LAB — COMPREHENSIVE METABOLIC PANEL
Anion Gap: 10 (ref 7–16)
BUN: 21 mg/dL — ABNORMAL HIGH (ref 7–18)
Bilirubin,Total: 5.8 mg/dL — ABNORMAL HIGH (ref 0.2–1.0)
Chloride: 106 mmol/L (ref 98–107)
Co2: 22 mmol/L (ref 21–32)
Creatinine: 1.27 mg/dL (ref 0.60–1.30)
EGFR (African American): >60
EGFR (Non-African Amer.): 54 — ABNORMAL LOW
Potassium: 4 mmol/L (ref 3.5–5.1)
SGPT (ALT): 69 U/L (ref 12–78)
Total Protein: 5.9 g/dL — ABNORMAL LOW (ref 6.4–8.2)

## 2012-08-07 LAB — COMPREHENSIVE METABOLIC PANEL
Albumin: 2.5 g/dL — ABNORMAL LOW (ref 3.4–5.0)
Alkaline Phosphatase: 166 U/L — ABNORMAL HIGH (ref 50–136)
BUN: 17 mg/dL (ref 7–18)
Calcium, Total: 8.5 mg/dL (ref 8.5–10.1)
Co2: 21 mmol/L (ref 21–32)
EGFR (Non-African Amer.): >60
Osmolality: 274 (ref 275–301)
SGOT(AST): 78 U/L — ABNORMAL HIGH (ref 15–37)
SGPT (ALT): 58 U/L (ref 12–78)
Sodium: 137 mmol/L (ref 136–145)

## 2012-08-07 LAB — LIPASE, BLOOD: Lipase: 1148 U/L — ABNORMAL HIGH (ref 73–393)

## 2012-08-08 LAB — COMPREHENSIVE METABOLIC PANEL
Bilirubin,Total: 4.5 mg/dL — ABNORMAL HIGH (ref 0.2–1.0)
Chloride: 106 mmol/L (ref 98–107)
Co2: 23 mmol/L (ref 21–32)
Creatinine: 0.98 mg/dL (ref 0.60–1.30)
EGFR (African American): >60
EGFR (Non-African Amer.): >60
SGOT(AST): 53 U/L — ABNORMAL HIGH (ref 15–37)
SGPT (ALT): 50 U/L (ref 12–78)

## 2012-08-08 LAB — CBC WITH DIFFERENTIAL/PLATELET
Basophil #: 0.1 10*3/uL (ref 0.0–0.1)
Lymphocyte #: 0.7 10*3/uL — ABNORMAL LOW (ref 1.0–3.6)
MCV: 94 fL (ref 80–100)
Monocyte %: 15.6 %
Neutrophil #: 3.6 10*3/uL (ref 1.4–6.5)
RBC: 4.31 10*6/uL — ABNORMAL LOW (ref 4.40–5.90)
RDW: 14 % (ref 11.5–14.5)
WBC: 5.3 10*3/uL (ref 3.8–10.6)

## 2012-08-09 LAB — COMPREHENSIVE METABOLIC PANEL
Albumin: 2.5 g/dL — ABNORMAL LOW (ref 3.4–5.0)
Alkaline Phosphatase: 166 U/L — ABNORMAL HIGH (ref 50–136)
BUN: 12 mg/dL (ref 7–18)
Bilirubin,Total: 3.5 mg/dL — ABNORMAL HIGH (ref 0.2–1.0)
Creatinine: 0.69 mg/dL (ref 0.60–1.30)
Glucose: 88 mg/dL (ref 65–99)
Osmolality: 277 (ref 275–301)
Sodium: 139 mmol/L (ref 136–145)

## 2012-08-09 LAB — CBC WITH DIFFERENTIAL/PLATELET
Basophil %: 0.3 %
Eosinophil %: 3.4 %
HCT: 42.2 % (ref 40.0–52.0)
Lymphocyte #: 0.7 10*3/uL — ABNORMAL LOW (ref 1.0–3.6)
MCHC: 34.1 g/dL (ref 32.0–36.0)
MCV: 94 fL (ref 80–100)
Monocyte #: 0.7 x10 3/mm (ref 0.2–1.0)
Monocyte %: 12.4 %
RDW: 14.1 % (ref 11.5–14.5)

## 2012-08-09 LAB — LIPASE, BLOOD: Lipase: 1220 U/L — ABNORMAL HIGH (ref 73–393)

## 2012-08-10 LAB — COMPREHENSIVE METABOLIC PANEL
Anion Gap: 9 (ref 7–16)
Calcium, Total: 8.3 mg/dL — ABNORMAL LOW (ref 8.5–10.1)
Co2: 21 mmol/L (ref 21–32)
Creatinine: 0.82 mg/dL (ref 0.60–1.30)
EGFR (Non-African Amer.): >60
Osmolality: 279 (ref 275–301)
SGOT(AST): 39 U/L — ABNORMAL HIGH (ref 15–37)
Sodium: 138 mmol/L (ref 136–145)

## 2012-08-10 LAB — CBC WITH DIFFERENTIAL/PLATELET
Basophil %: 0.1 %
Eosinophil %: 0 %
HGB: 13.9 g/dL (ref 13.0–18.0)
MCH: 32.5 pg (ref 26.0–34.0)
MCV: 94 fL (ref 80–100)
Monocyte #: 0.6 x10 3/mm (ref 0.2–1.0)
Monocyte %: 6.9 %
Neutrophil %: 88 %
RBC: 4.29 10*6/uL — ABNORMAL LOW (ref 4.40–5.90)
WBC: 8.2 10*3/uL (ref 3.8–10.6)

## 2012-08-10 LAB — LIPASE, BLOOD: Lipase: 258 U/L (ref 73–393)

## 2012-09-11 ENCOUNTER — Ambulatory Visit: Payer: Self-pay | Admitting: Gastroenterology

## 2012-11-30 IMAGING — CT CT HEAD WITHOUT CONTRAST
2 series · 15 of 30 positions shown, 19 images · non-contrast
Comparison: none

REASON FOR EXAM: syncope
COMMENTS:

PROCEDURE:     CT  - CT HEAD WITHOUT CONTRAST  - February 14, 2011  [DATE]
RESULT:     Comparison:  None
TECHNIQUE: Multiple axial images from the foramen magnum to the vertex were
obtained without IV contrast.

[Series 2: without · axial · non-contrast · 0.42mm/px · z∈[+909,+1034]mm · 13 of 31 slices shown, 17 images]
[im 3/31  brain]
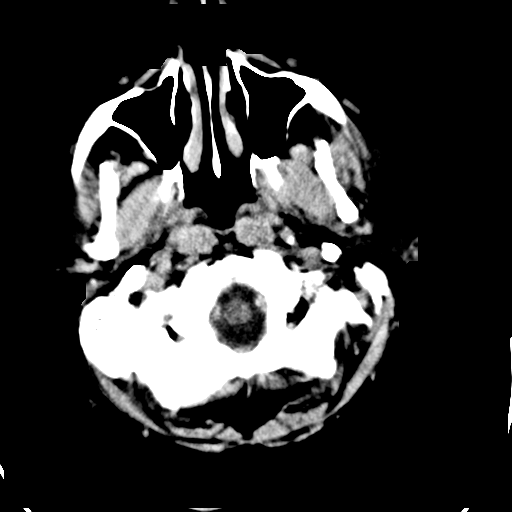
[im 3/31  bone]
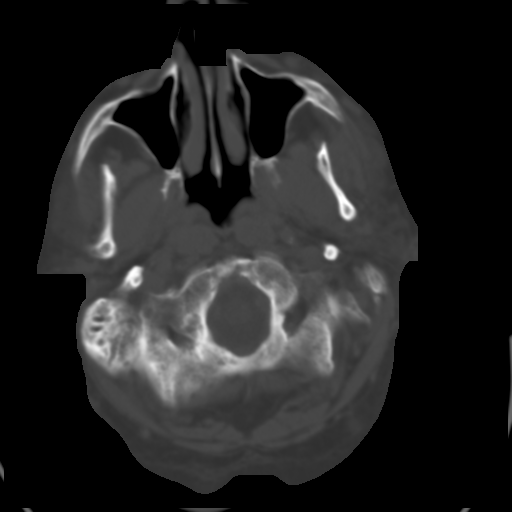
[im 5/31  brain]
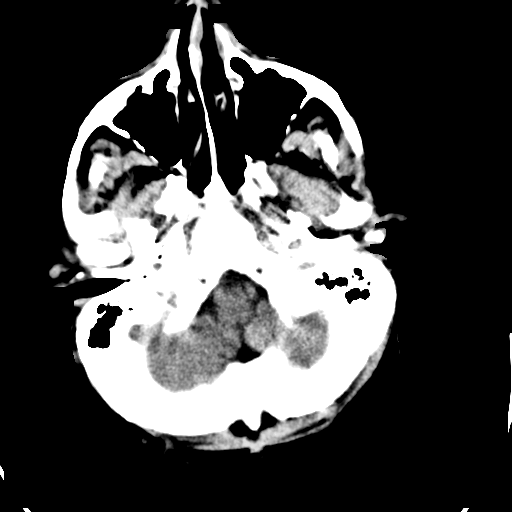
[im 7/31  brain]
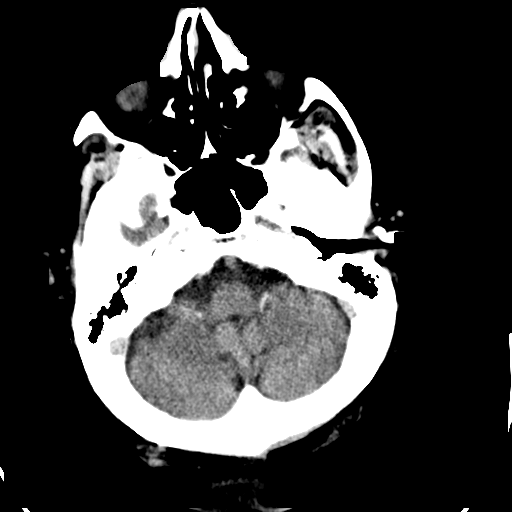
[im 9/31  brain]
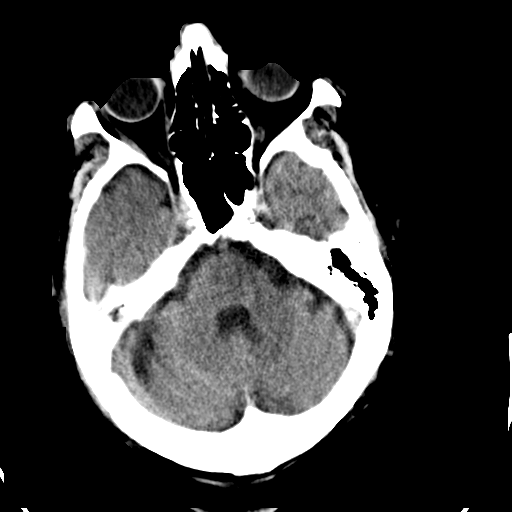
[im 11/31  brain]
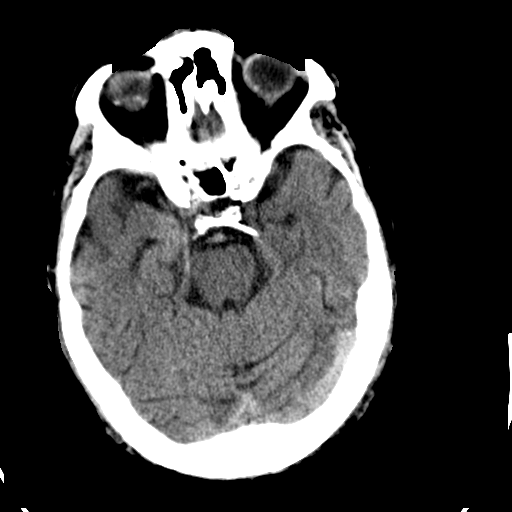
[im 11/31  bone]
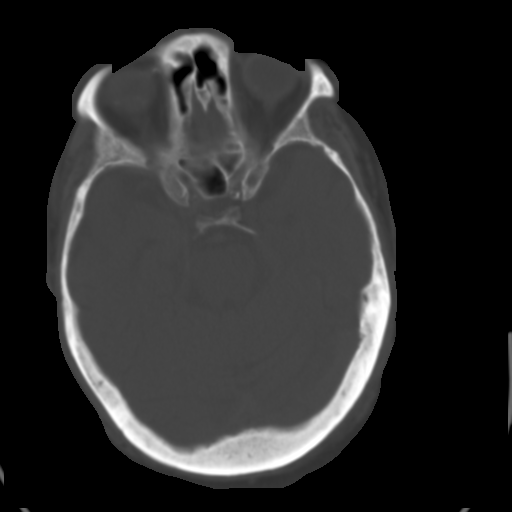
[im 13/31  brain]
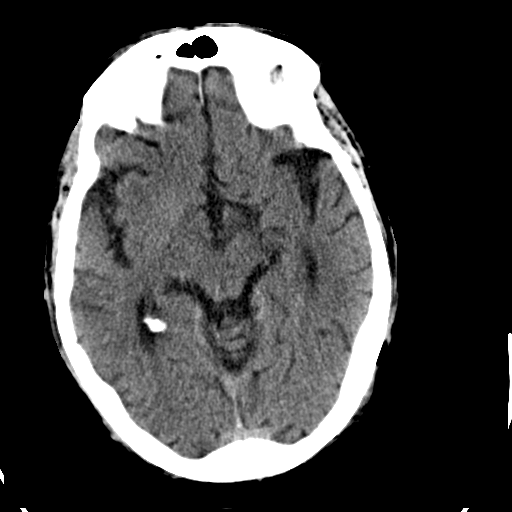
[im 16/31  brain]
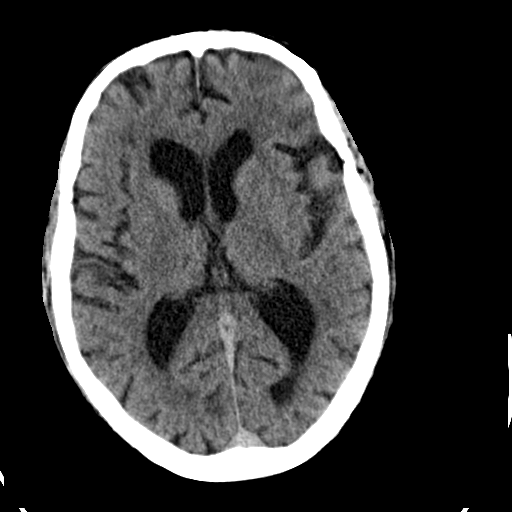
[im 18/31  brain]
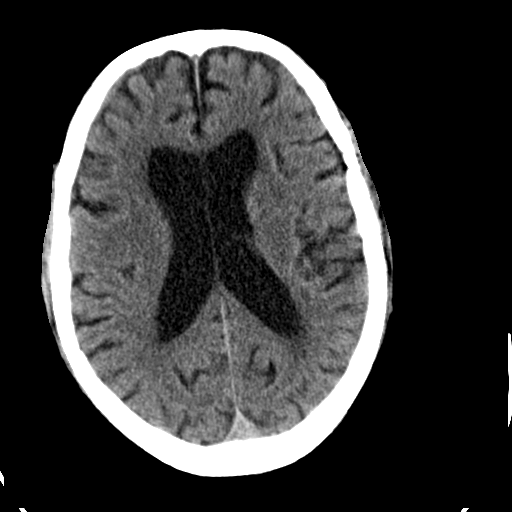
[im 20/31  brain]
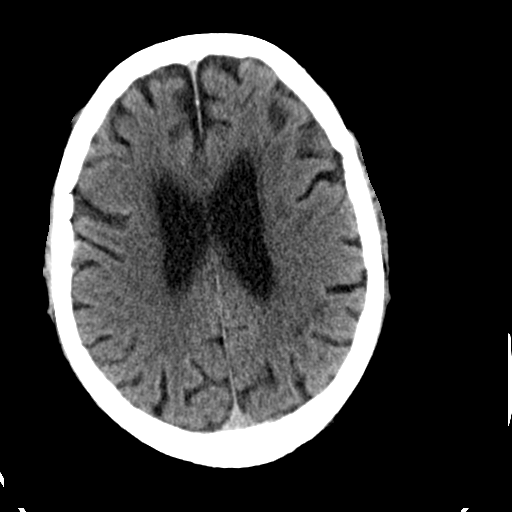
[im 20/31  bone]
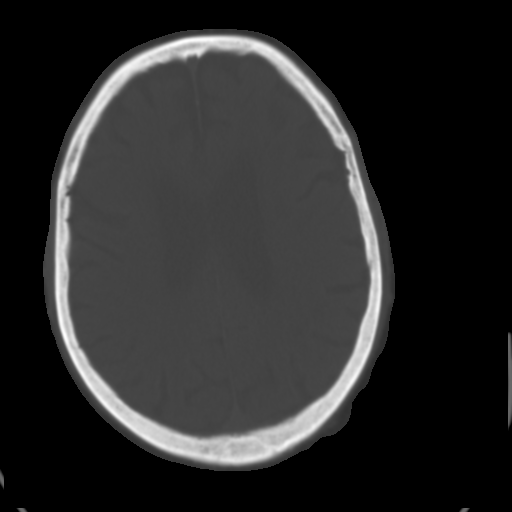
[im 22/31  brain]
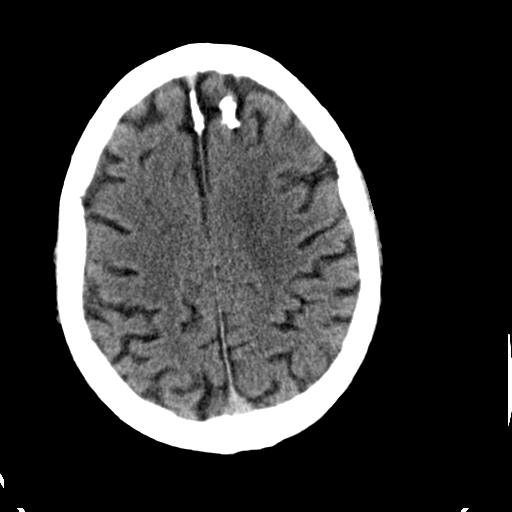
[im 24/31  brain]
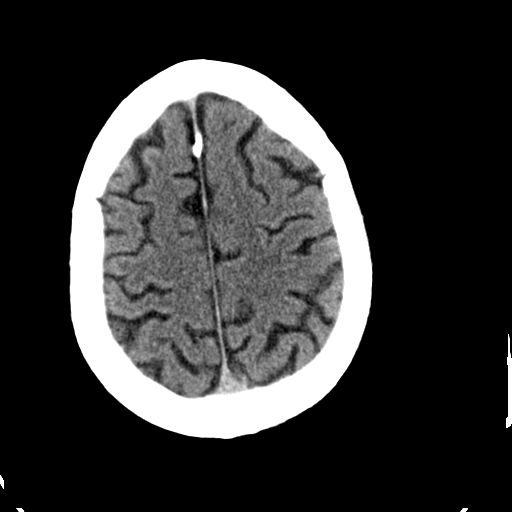
[im 26/31  brain]
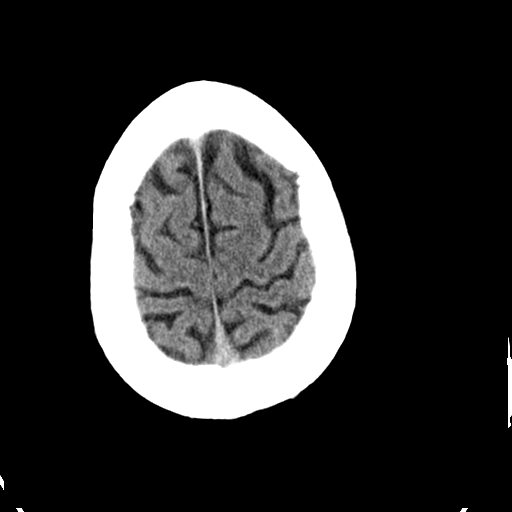
[im 28/31  brain]
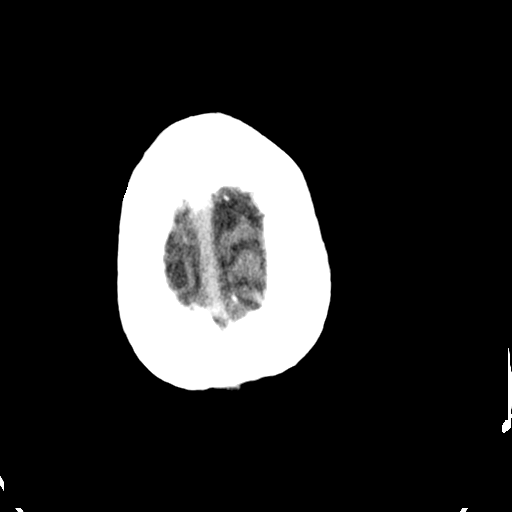
[im 28/31  bone]
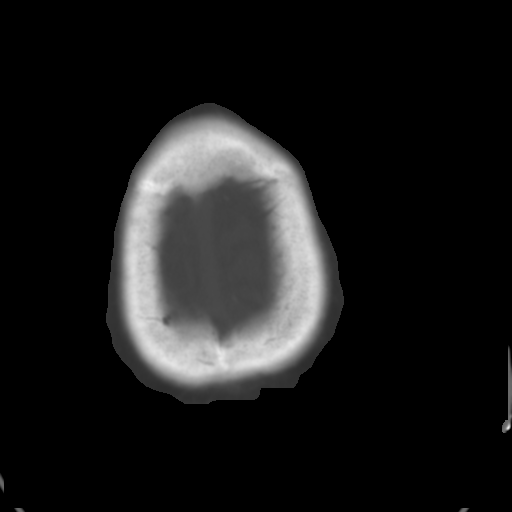

[Series 3: bone · axial · 0.42mm/px · z∈[+909,+929]mm · 2 of 30 slices shown]
[im 3/30  bone]
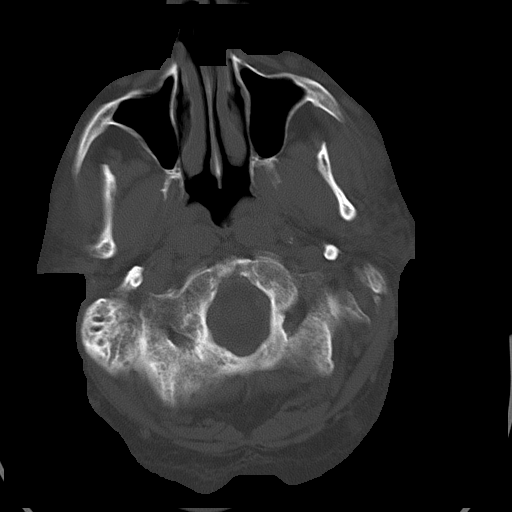
[im 7/30  bone]
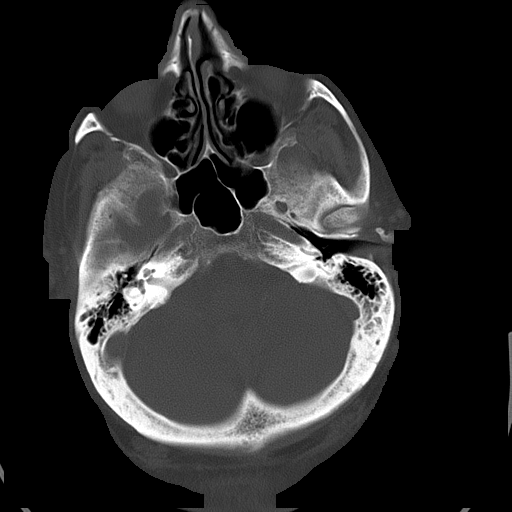

[15 of 30 positions shown; findings below may reference images not displayed]

FINDINGS: There is no evidence of mass effect, midline shift, or extra-axial fluid
collections.  There are dystrophic cortical calcifications in the left
frontal lobe. There is no evidence of a space-occupying lesion or
intracranial hemorrhage. There is no evidence of a cortical-based area of
acute infarction. There is generalized cerebral atrophy. There is
periventricular white matter low attenuation likely secondary to
microangiopathy.

The ventricles and sulci are appropriate for the patient's age. The basal
cisterns are patent.

Visualized portions of the orbits are unremarkable. The visualized portions
of the paranasal sinuses and mastoid air cells are unremarkable.
Cerebrovascular atherosclerotic calcifications are noted.

The osseous structures are unremarkable.
IMPRESSION: No acute intracranial process.

## 2014-01-10 ENCOUNTER — Emergency Department: Payer: Self-pay | Admitting: Emergency Medicine

## 2014-01-10 LAB — CBC
HCT: 44.3 % (ref 40.0–52.0)
HGB: 14.7 g/dL (ref 13.0–18.0)
MCH: 31.8 pg (ref 26.0–34.0)
MCHC: 33.3 g/dL (ref 32.0–36.0)
MCV: 96 fL (ref 80–100)
Platelet: 128 10*3/uL — ABNORMAL LOW (ref 150–440)
RBC: 4.63 10*6/uL (ref 4.40–5.90)
RDW: 13.6 % (ref 11.5–14.5)
WBC: 5.4 10*3/uL (ref 3.8–10.6)

## 2014-01-10 LAB — BASIC METABOLIC PANEL
ANION GAP: 4 — AB (ref 7–16)
BUN: 24 mg/dL — AB (ref 7–18)
CHLORIDE: 110 mmol/L — AB (ref 98–107)
Calcium, Total: 8.6 mg/dL (ref 8.5–10.1)
Co2: 26 mmol/L (ref 21–32)
Creatinine: 1.16 mg/dL (ref 0.60–1.30)
EGFR (African American): >60
GFR CALC NON AF AMER: 59 — AB
GLUCOSE: 119 mg/dL — AB (ref 65–99)
OSMOLALITY: 285 (ref 275–301)
Potassium: 4 mmol/L (ref 3.5–5.1)
Sodium: 140 mmol/L (ref 136–145)

## 2014-01-10 LAB — TROPONIN I: Troponin-I: <0.02 ng/mL

## 2014-12-28 ENCOUNTER — Ambulatory Visit: Payer: Self-pay | Admitting: Cardiology

## 2014-12-28 LAB — CBC WITH DIFFERENTIAL/PLATELET
BASOS ABS: 0 10*3/uL (ref 0.0–0.1)
BASOS PCT: 0.9 %
Eosinophil #: 0.1 10*3/uL (ref 0.0–0.7)
Eosinophil %: 2.6 %
HCT: 45.5 % (ref 40.0–52.0)
HGB: 14.9 g/dL (ref 13.0–18.0)
LYMPHS ABS: 0.9 10*3/uL — AB (ref 1.0–3.6)
LYMPHS PCT: 18.1 %
MCH: 31.2 pg (ref 26.0–34.0)
MCHC: 32.8 g/dL (ref 32.0–36.0)
MCV: 95 fL (ref 80–100)
Monocyte #: 0.6 x10 3/mm (ref 0.2–1.0)
Monocyte %: 11.3 %
NEUTROS ABS: 3.4 10*3/uL (ref 1.4–6.5)
Neutrophil %: 67.1 %
Platelet: 126 10*3/uL — ABNORMAL LOW (ref 150–440)
RBC: 4.79 10*6/uL (ref 4.40–5.90)
RDW: 15.5 % — AB (ref 11.5–14.5)
WBC: 5.1 10*3/uL (ref 3.8–10.6)

## 2014-12-28 LAB — BASIC METABOLIC PANEL
ANION GAP: 4 — AB (ref 7–16)
BUN: 21 mg/dL — AB (ref 7–18)
CREATININE: 1.07 mg/dL (ref 0.60–1.30)
Calcium, Total: 8.8 mg/dL (ref 8.5–10.1)
Chloride: 106 mmol/L (ref 98–107)
Co2: 30 mmol/L (ref 21–32)
GLUCOSE: 79 mg/dL (ref 65–99)
OSMOLALITY: 281 (ref 275–301)
Potassium: 4.2 mmol/L (ref 3.5–5.1)
Sodium: 140 mmol/L (ref 136–145)

## 2014-12-28 LAB — PROTIME-INR
INR: 1
Prothrombin Time: 13.8 secs

## 2014-12-28 LAB — APTT: Activated PTT: 32.2 secs (ref 23.6–35.9)

## 2014-12-30 ENCOUNTER — Ambulatory Visit: Payer: Self-pay | Admitting: Cardiology

## 2015-01-15 ENCOUNTER — Observation Stay: Payer: Self-pay | Admitting: Internal Medicine

## 2015-03-09 NOTE — Consult Note (Signed)
PATIENT NAME:  Gregory [name], Gregory [name] MR#:  [id number] DATE OF BIRTH:  10-23-1932  DATE OF CONSULTATION:  08/04/2012  REFERRING PHYSICIAN:   CONSULTING PHYSICIAN:  Gregory DelShaukat Mckenzi Buonomo, MD  PRIMARY CARE PHYSICIAN: Gregory [name]  REASON FOR CONSULTATION: Acute abdominal pain, nausea and vomiting.   HISTORY OF PRESENT ILLNESS: 81 year old Caucasian male with history of coronary artery disease, history of pacemaker insertion, mild congestive heart failure, systemic hypertension and hyperlipidemia. Patient was admitted today with few hours history of acute onset epigastric pain associated with nausea and vomiting. Liver enzymes were abnormal. Serum lipase was more than 3000 and patient was admitted with [name] diagnosis of acute pancreatitis. GI was consulted for possible choledocholithiasis. [name] few hours later patient was evaluated in his room up on the floor. He denies any abdominal pain. He has had no further nausea, vomiting and he is asymptomatic at this time. Bilirubin was about 2.5 on arrival, is now about 5.5. Ultrasound of the abdomen showed gallstones and [name] normal caliber common bile duct.   PAST MEDICAL HISTORY:  1. History of coronary artery disease.  2. Mild congestive heart failure with an ejection fraction of 45$ to 50% according to records.  3. History of chronic anemia. 4. History of pacemaker insertion.  5. Systemic hypertension. 6. Hyperlipidemia. 7. Benign prostatic hypertrophy.   PAST SURGICAL HISTORY:  1. Coronary artery bypass grafting in 2008.  2. Cardiac catheterization in November 2011.  3. Pacemaker placement.  4. Hemorrhoidectomy.  5. Laser eye surgery.   SOCIAL HISTORY: He used to be [name] smoker, does not smoke. He does not drink.   FAMILY HISTORY: Quite unremarkable.   HOME MEDICATIONS:  1. Omeprazole. 2. Metapleural. 3. Magnesium. 4. Lasix. 5. Ferrous sulfate. 6. Colace. 7. Senna. 8. Aspirin. 9. Altace.   ALLERGIES: Penicillin.   REVIEW OF SYSTEMS: Grossly  negative except for what is mentioned in the History of Present Illness.   PHYSICAL EXAMINATION:  GENERAL: Obese male, does not appear to be in any acute distress, quite awake and alert. Clinically appears to be mildly jaundiced.   NECK: Neck veins are flat.   LUNGS: Grossly clear to auscultation bilaterally.   CARDIOVASCULAR: Regular rate and rhythm.   ABDOMEN: Abdominal examination is difficult to perform due to morbid obesity. No right upper quadrant or epigastric tenderness was noted. There is no hepatosplenomegaly or ascites.   NEUROLOGIC: Examination appears to be unremarkable.   LABORATORY, DIAGNOSTIC, AND RADIOLOGICAL DATA: White cell count 11.2, hemoglobin 17.5, hematocrit 49.6, platelet count 220. Serum bilirubin is now 5.1 with [name] direct bilirubin of 3.7, alkaline phosphatase 195, ALT 120, AST 213. Creatinine is 1.89 on arrival. Total bilirubin was 2.4, alkaline phosphatase 194, ALT 75 and AST 171. Troponins are negative. Ultrasound of the abdomen showed cholelithiasis, mild gallbladder wall thickening and normal common bile duct.   ASSESSMENT AND PLAN: Patient with what appears to be gallstone pancreatitis although surprisingly his abdominal pain has resolved in [name] relatively short period of time. Ultrasound showed gallstones and mild gallbladder wall thickening. CBD was normal although patient's bilirubin has increased. Most likely he has passed [name] stone although retained common bile duct stones remain [name] concern. I would to continue to keep him n.p.o., repeat LFTs in the morning as well as serum lipase. I will slightly increase his IV fluid to 100 mL/h due to acute pancreatitis and requirement for hydration keeping in mind that he does have mild congestive heart failure. Control of pain. Agree with current IV antibiotics as he is allergic  to penicillin. Depending on patient's liver enzymes will make further recommendations in terms of either an ERCP or an M.R.C.P. prior to cholecystectomy.  Plan has been discussed with the patient. He is in full agreement. Will follow.   ____________________________ Gregory Del, MD si:cms D: 08/04/2012 11:26:54 ET T: 08/04/2012 11:38:27 ET JOB#: [id number]  cc: Gregory Del, MD, <Dictator>  Gregory Del MD ELECTRONICALLY SIGNED 08/12/2012 12:36

## 2015-03-09 NOTE — Consult Note (Signed)
Chief Complaint:   Subjective/Chief Complaint Feels well. No complaints.   VITAL SIGNS/ANCILLARY NOTES: **Vital Signs.:   19-Sep-13 10:37   Vital Signs Type Q 4hr   Temperature Temperature (F) 98.4   Celsius 36.8   Temperature Source Oral   Pulse Pulse 59   Respirations Respirations 18   Systolic BP Systolic BP 604   Diastolic BP (mmHg) Diastolic BP (mmHg) 68   Mean BP 89   Pulse Ox % Pulse Ox % 95   Pulse Ox Activity Level  At rest   Oxygen Delivery Room Air/ 21 %   Brief Assessment:   Additional Physical Exam Abdomen is benign.   Lab Results: Hepatic:  19-Sep-13 05:19    Bilirubin, Total  4.5   Alkaline Phosphatase  188   SGPT (ALT) 50   SGOT (AST)  53   Total Protein, Serum  6.2   Albumin, Serum  2.6  Routine Chem:  19-Sep-13 05:19    Glucose, Serum 95   BUN 15   Creatinine (comp) 0.98   Sodium, Serum 140   Potassium, Serum 3.6   Chloride, Serum 106   CO2, Serum 23   Calcium (Total), Serum 8.7   Osmolality (calc) 280   eGFR (African American) >60   eGFR (Non-African American) >60 (eGFR values <66m/min/1.73 m2 may be an indication of chronic kidney disease (CKD). Calculated eGFR is useful in patients with stable renal function. The eGFR calculation will not be reliable in acutely ill patients when serum creatinine is changing rapidly. It is not useful in  patients on dialysis. The eGFR calculation may not be applicable to patients at the low and high extremes of body sizes, pregnant women, and vegetarians.)   Anion Gap 11   Lipase  2030 (Result(s) reported on 08 Aug 2012 at 06:38AM.)   Assessment/Plan:  Assessment/Plan:   Assessment Biliary obstruction most likely secondary to sludge s/p ERCP and stenting with improvement in LFT's. Pancreatitis, clinically much better. Gallstones.    Plan Advance diet slowly. May proceed with cholecystectomy. Will sign off. Please call on call GI if needed.  Case discussed with Dr. PPosey Pronto   Electronic  Signatures: IJill Side(MD)  (Signed 19-Sep-13 14:18)  Authored: Chief Complaint, VITAL SIGNS/ANCILLARY NOTES, Brief Assessment, Lab Results, Assessment/Plan   Last Updated: 19-Sep-13 14:18 by IJill Side(MD)

## 2015-03-09 NOTE — Consult Note (Signed)
Chief Complaint:   Subjective/Chief Complaint Patient's bilirubin continues to rise. Lipase remains very high although he denies abdominal pain. MRCP is not possible and therefore will proceed with an ERCP today. The procedure and its potential complications were discussed with him, including but not limited to , bleeding, perforation and pancreatitis. The post ERCP pancreatitis was especially mentioned along with the fact that some of the cases may lead to nultiorgan failure and even death. He is in full agreement. Further recommendations to follow.   Electronic Signatures: Lurline DelIftikhar, Samnang Shugars (MD)  (Signed 17-Sep-13 09:01)  Authored: Chief Complaint   Last Updated: 17-Sep-13 09:01 by Lurline DelIftikhar, Tyreak Reagle (MD)

## 2015-03-09 NOTE — Discharge Summary (Signed)
PATIENT NAME:  Gregory [name], Gregory [name] MR#:  [id number] DATE OF BIRTH:  1932/05/21  DATE OF ADMISSION:  08/04/2012 DATE OF DISCHARGE:  08/11/2012  DISCHARGE DIAGNOSES:  1. Biliary pancreatitis.  2. Abdominal pain secondary to biliary pancreatitis, status post stenting in common bile duct and cholecystectomy with J-P draining.  3. Hypertension.  4. Acute renal failure, resolved.   5. Coronary artery disease with history of bypass.  6. Permanent pacemaker.  7. Hyperlipidemia.  8. Benign prostatic hypertrophy.  9. Anemia. 10. History of arteriovenous malformations.   DISCHARGE MEDICATIONS:  1. Aspirin 81 mg daily.  2. Magnesium 400 mg p.o. daily. 3. Ferrous sulfate 325 mg p.o. daily.  4. Metoprolol 25 mg p.o. b.i.d.  5. Tamsulosin 0.4 mg p.o. daily. 6. Omeprazole 20 mg p.o. daily.  7. Altace 10 mg p.o. b.i.d.  8. Colace 100 mg p.o. b.i.d.  9. Allopurinol 100 mg p.o. daily.  10. KCl 20 mEq half tablet p.o. b.i.d.  11. Stop Lasix.   DIET: Low-sodium, low-fat, diet.  FOLLOWUP: Follow up with Dr. Egbert GaribaldiBird on discharge.  CONSULTATIONS: Surgical consult with Dr. Egbert GaribaldiBird and GI consult with Dr Niel HummerIftikhar.     PROCEDURES: ERCP and cholecystectomy.   HOSPITAL COURSE:  Abdominal pain with pancreatitis.  [name] 81 year old male with history of coronary artery disease, bypass surgery, hypertension, hyperlipidemia, came in with abdominal pain and nausea and vomiting. He was found to have AST 171, ALT 75, alkaline phosphatase 194, bilirubin of 2.4 when he came and BNP was 1349, creatinine 1.8. He is admitted for abdominal pain with possible gallstones and gallstone pancreatitis. Patient kept n.p.o., continued on IV fluids. His lipase was more than 3000 on admission. Because of elevated LFTs and the lipase elevation, the patient was kept n.p.o. along with IV fluids and IV antibiotics. Chest x-ray showed some pulmonary edema but patient seen by the gastroenterologist, Dr. Niel HummerIftikhar, and had ERCP done.  ERCP was done  on August 06, 2012, which showed dilated common bile duct. The patient had [name] biliary sphincterotomy with removal of calculus from biliary and pancreatic ducts, and they inserted [name] stent into CBD. The patient had cholecystectomy  for cholelithiasis because of persistent elevation of lipase  even after the exploration of the CBD with stenting. It was chosen to have cholecystectomy.   The patient had [name] cholecystectomy with J-P draining by Dr. Egbert GaribaldiBird. Patient found to have extensive cholelithiasis with an inflamed gallbladder, and purulent fluid was seen within the gallbladder .pt is pain free and afbrile after surgery , he is discharged home with J-P drain.  Dr. Egbert GaribaldiBird wants to see him discharge day regarding discontinuation of J-P drain. Patient's abdominal pain resolved, and lipase also returned back to normal. His LFTs started to trend down.  On August 10, 2012, alkaline phosphatase 149, ALT 32, AST 39. The patient's lipase was 258 on August 10, 2012. The patient's LFTs on admission showed ALT 189, AST of 94, alkaline phosphatase 180 and bilirubin was 5.8 on September 17,2013. The patient's CA- 99 is elevated at 749 but Dr Egbert GaribaldiBird wants to repeat that again in 3 weeks to make sure if it is elevated or not because of his pancreatitis. Patient's Lasix was stopped and his other blood pressure medications were continued, and discharge blood pressure 124/72, pulse 60. The patient's temperature 97.4/94 percent on room air. He got Cipro 500 mg p.o. b.i.d. for 5 days and Flagyl 500 mg q.8 hours for 5 days prescription. The patient advised to stop Lasix and take his  Lasix only if he is getting edematous.  TIME SPENT ON DISCHARGE PREPARATION: More than 30 minutes. Discussed the plan with patient's son at bedside.   ____________________________ Katha Hamming, MD sk:vtd D: 08/12/2012 14:11:17 ET T: 08/14/2012 14:29:35 ET  JOB#: [id number] cc: Katha Hamming, MD, <Dictator> Katha Hamming  MD ELECTRONICALLY SIGNED 08/25/2012 14:19

## 2015-03-09 NOTE — Consult Note (Signed)
PATIENT NAME:  Gregory [name], Gregory [name] MR#:  [id number] DATE OF BIRTH:  1932-09-03  DATE OF CONSULTATION:  08/08/2012  REFERRING PHYSICIAN:   CONSULTING PHYSICIAN:  Adrian Dinovo A. Egbert Garibaldi, MD  REASON FOR CONSULTATION: Evaluation for cholecystectomy.   HISTORY: This is a 81 year old white male with onset of epigastric pain and emesis on the morning of 08/04/2012. Patient states that he had the sudden onset of epigastric abdominal pain pressure-like feeling in his stomach which lasted 1-1/2 to 2 hours then eased off but did not resolve itself. It was associated with several episodes of emesis. No fevers, no obvious jaundice, no sick contacts. No previous pain like this in the past according to his history.   Patient does have a history of coronary artery disease, congestive heart failure, need for pacemaker, hypertension, and hyperlipidemia. He has had a previous history of coronary artery bypass grafting as well. Patient is followed by Dr. Juliann Pares for his medical and cardiological needs. He was found to have an elevated creatinine as well as liver function tests and bilirubin on admission to the [facility] medical service. The patient was then seen by Dr. Lurline Del of GI medicine at which point recommendations were made for ERCP as his bilirubin increased to a little over 7 and on 09/17 the patient underwent an ERCP with removal of sludge and small stones from the common bile duct and placement of a 10 French biliary stent. Since then the patient has done well. His pain has resolved, his lipase has remained somewhat elevated. Total bilirubin went from 7.2 to 5.8 on the 17th, 5.0 on the 18th and has dropped to 4-1/2 today. AST and ALT have nearly normalized. White count on the 16th was 16.8. Surgical services were consulted regarding the possibility of cholecystectomy during this hospitalization.   ALLERGIES: Cortisone and penicillin.   HOME MEDICATIONS: 1. Allopurinol 100 mg by mouth once a day. 2. Altace 10 mg  by mouth b.i.d.  3. Aspirin 81 mg by mouth once a day. 4. Colace 100 mg by mouth b.i.d.  5. Ferrous sulfate 325 mg by mouth once a day. 6. Klor-Con M 20 oral tablet extended release half tablet once a day. 7. Lasix 20 mg by mouth b.i.d.  8. Magnesium oxide 400 mg by mouth once a day. 9. Metoprolol tartrate 25 mg by mouth b.i.d.  10. Omeprazole 20 mg by mouth once a day.  11. Tamsulosin 0.4 mg by mouth once a day.   PAST MEDICAL HISTORY:  1. Hypertension. 2. Coronary artery disease. 3. Need for pacemaker. 4. Obesity.  5. Hyperlipidemia.   PAST SURGICAL HISTORY: As described above no abdominal operations noted.   REVIEW OF SYSTEMS: As described in the history of present illness and otherwise negative.   FAMILY HISTORY: Noncontributory for gallstones. There is a family history of cancer, coronary artery disease.   SOCIAL HISTORY: Ex-smoker, 30 pack-year history, quit in 1989. No recent alcohol use. No IV drug use. Lives alone near his son. Retired Curator.   PHYSICAL EXAMINATION:  GENERAL: The patient is alert and oriented and slightly icteric.  VITAL SIGNS: Temperature 98.4, pulse 73, blood pressure 152/73.   HEENT: Sclerae demonstrate mild icterus. Facies are symmetrical.   NECK: Supple. No adenopathy.   HEART: Regular rate and rhythm.   LUNGS: Clear bilaterally.   ABDOMEN: Soft, obese and nontender in the epigastrium. Negative Murphy sign present.   NEUROLOGIC: Grossly alert and oriented.   PSYCHIATRIC: Appropriate mood, judgment, and affect.   MUSCULOSKELETAL: Normal.  LABORATORY, DIAGNOSTIC AND RADIOLOGICAL DATA: Total bilirubin 4.5, alkaline phosphatase 188, AST 53, ALT 50, lipase 2300, creatinine 0.98, sodium 140, potassium 3.4. White count on the 16th 6.8, hemoglobin 14.1, hematocrit 41.0, platelet count 144,000.   IMPRESSION:  1. Choledocholithiasis with biliary pancreatitis and cholelithiasis.  2. History of coronary artery disease. 3. Hypertension.   4. Mild thrombocytopenia.   PLAN: The patient will undergo a laparoscopic cholecystectomy tomorrow. I will recheck a CBC to insure this platelet count is not any different than it was initially. I discussed with him the procedure as well as his son, went over the risks of surgery including that of bleeding, infection, need for general anesthesia, need for conversion to open operation, bile duct injury, leak, need for further surgery. All of their questions were answered.   TOTAL TIME SPENT: 60 minutes.  ____________________________ Redge GainerMark A. Egbert GaribaldiBird, MD mab:cms D: 08/08/2012 15:06:52 ET T: 08/09/2012 09:08:07 ET JOB#: [id number]  cc: Loraine LericheMark A. Egbert GaribaldiBird, MD, <Dictator> Lurline DelShaukat Iftikhar, MD Dwayne D. Juliann Paresallwood, MD  Raynald KempMARK A Liat Mayol MD ELECTRONICALLY SIGNED 08/11/2012 17:16

## 2015-03-09 NOTE — Op Note (Signed)
PATIENT NAME:  Gregory [name], Child A MR#:  [id number] DATE OF BIRTH:  12-30-1931  DATE OF PROCEDURE:  08/09/2012  PREOPERATIVE DIAGNOSES: Choledocholithiasis, biliary pancreatitis, cholelithiasis.  POSTOPERATIVE DIAGNOSES: Choledocholithiasis, biliary pancreatitis, cholelithiasis, acute calculus cholecystitis.   PROCEDURE PERFORMED: Laparoscopic cholecystectomy.   SURGEON: Jaevin Medearis A. Egbert GaribaldiBird, M.D.   ASSISTANT: Ignacia Bayleyerri Duncan CST  TYPE OF ANESTHESIA: General oral endotracheal.  FINDINGS: Extensive cholelithiasis, markedly inflamed gallbladder and hepatoduodenal ligament with purulent fluid seen within the gallbladder.   SPECIMENS: Gallbladder with contents to pathology.   DRAINS: 6919 JamaicaFrench Blake drain in [employer] Data SystemsMorison's pouch.   ESTIMATED BLOOD LOSS: 100 mL.   DESCRIPTION OF PROCEDURE: The patient in supine position. General oral endotracheal anesthesia. Sterile prep and drape of the abdomen after clipping of hair. Time out was observed. A 12 mm blunt Hassan trocar was placed through an open technique through a transversely oriented skin incision with stay sutures passed through the fascia. Pneumoperitoneum was established. A 5 mm Bladeless trocar was then placed in the epigastric region, then two 5 mm ports were then placed in the right subcostal margin. The gallbladder was densely adherent to the omentum. The omentum was then taken down with blunt technique. Cast of the gallbladder was found on the omentum.   The gallbladder was then aspirated of about 60 mL of clear bile. It was grasped along its fundus and elevated towards the right shoulder. There was a dense inflammatory rind around the hepatoduodenal ligament which was taken down with blunt technique. Minimal cautery was used. In the process of doing this the cystic duct tore completely in half from very friable tissue. In the area of the porta hepatis, there was a dense inflammatory reaction. As such I did not place any clips into this area feeling  that the clips would not hold and it would create more local trauma. The cystic artery was identified, doubly clipped on the portal side, singly clipped on the gallbladder side, and divided. Further dissection in this area demonstrated no evidence of aberrant bile duct or arterial structure. Adhesions and rind were taken down with blunt technique. The gallbladder was then retrieved off the gallbladder fossa with hook cautery apparatus and in doing so a rent was made in the gallbladder wall and a large amount of pus was extruded. This was immediately aspirated. The gallbladder tore itself off posteriorly leaving a small remnant near the area of the hepatoduodenal ligament which was transected off the gallbladder specimen with cautery and left in situ. It was rather adherent to this area and as such was left in place. The patient had had a pre-operative ERCP with stenting of the bile duct.  With the specimen taken off the liver bed, it was placed into an Endo Catch device and in order to achieve this the epigastric port was exchanged for an 11 mm bladeless port. It was placed into an Endo Catch device and retrieved. The remaining stones were then individually retrieved with the 10 mm stone forceps. The right upper quadrant during the operation was irrigated with a total of 2 liters of warm normal saline. Hemostasis in the gallbladder fossa was obtained with point cautery and direct pressure utilizing a Ray-Tec sponge as well as 10 mL of Surgiflo with thrombin application. A 19 [place] Blake drain was directed into this space and exited the right lower quadrant trocar site. It was secured at the skin site with two 3-0 nylon sutures. It was directed into Morison's pouch. A large tongue of omentum was placed into  the right upper quadrant. Ports were then removed under direct visualization. The infraumbilical fascial defect was closed with a figure-of-eight vertically oriented #0 Vicryl suture with the existing stay  sutures tied to each other. A total of 30 mL of 0.25% plain Marcaine was infiltrated along all skin and fascial incisions, at the completion of the operation. 4-0 Vicryl subcuticular was applied to all skin edges followed by benzoin, Steri-Strips, and occlusive sterile dressings. The patient was then subsequently extubated and taken to the recovery room in stable and satisfactory condition by anesthesia services.  ____________________________ Gregory Gainer Egbert Garibaldi, MD mab:slb D: 08/10/2012 09:48:00 ET     T: 08/10/2012 12:21:08 ET       JOB#: [id number] cc: Lurline Del, MD Dwayne D. Juliann Pares, MD Gregory Gainer. Egbert Garibaldi, MD, <Dictator> Raynald Kemp MD ELECTRONICALLY SIGNED 08/11/2012 17:19

## 2015-03-09 NOTE — Consult Note (Signed)
Brief Consult Note: Diagnosis: Probable acute gallstone pancreatirtis.   Patient was seen by consultant.   Consult note dictated.   Comments: Probable acute gallstone pancreatitis. Cholelithiasis. Abnormal LFT's, ? choledocholithiasis. CBD 5 mm on US.  Recommendations: NPO. IV hydration. Repeat labs in am. ? ERCP vs MRCP depending on LFT's. Will follow.  Electronic Signatures: Lurline DelIftikhar, Kassius Battiste (MD)  (Signed 15-Sep-13 11:20)  Authored: Brief Consult Note   Last Updated: 15-Sep-13 11:20 by Lurline DelIftikhar, Adeola Dennen (MD)

## 2015-03-09 NOTE — Consult Note (Signed)
Chief Complaint:   Subjective/Chief Complaint No symptoms. Denies abdominal pain or vomiting.   VITAL SIGNS/ANCILLARY NOTES: **Vital Signs.:   18-Sep-13 09:41   Vital Signs Type Q 4hr   Temperature Temperature (F) 98.2   Celsius 36.7   Temperature Source Oral   Pulse Pulse 59   Respirations Respirations 20   Systolic BP Systolic BP 657   Diastolic BP (mmHg) Diastolic BP (mmHg) 64   Mean BP 91   Pulse Ox % Pulse Ox % 95   Pulse Ox Activity Level  At rest   Oxygen Delivery Room Air/ 21 %   Brief Assessment:   Additional Physical Exam Abdomen is soft and benign.   Lab Results: Hepatic:  18-Sep-13 06:10    Bilirubin, Total  5.0   Alkaline Phosphatase  166   SGPT (ALT) 58   SGOT (AST)  78   Total Protein, Serum  5.9   Albumin, Serum  2.5  Routine Chem:  18-Sep-13 06:10    Glucose, Serum 78   BUN 17   Creatinine (comp) 0.74   Sodium, Serum 137   Potassium, Serum 3.9   Chloride, Serum 106   CO2, Serum 21   Calcium (Total), Serum 8.5   Osmolality (calc) 274   eGFR (African American) >60   eGFR (Non-African American) >60 (eGFR values <27m/min/1.73 m2 may be an indication of chronic kidney disease (CKD). Calculated eGFR is useful in patients with stable renal function. The eGFR calculation will not be reliable in acutely ill patients when serum creatinine is changing rapidly. It is not useful in  patients on dialysis. The eGFR calculation may not be applicable to patients at the low and high extremes of body sizes, pregnant women, and vegetarians.)   Anion Gap 10   Lipase  1148 (Result(s) reported on 07 Aug 2012 at [facility])   Radiology Results: [place]    15-Sep-13 03:24, [place]   [place]    REASON FOR EXAM:    abdominal pain - epigastric  COMMENTS:   May transport without cardiac monitor    PROCEDURE: [place] - [place] GENERAL SURVEY  - Aug 04 2012  3:24AM     RESULT: Comparison: None.    Technique: Multiple grayscale and  color Doppler images were obtained of   the abdomen.    Findings:  The visualized liver and spleen are unremarkable. The pancreas was mostly   obscured by overlying bowel gas. The portal vein is patent. There are   mobile stones and sludge in the gallbladder. There is mild gallbladder   wall thickening, measuring 3-4 mm in thickness. The sonographic MPercell Miller    sign was negative. The common bile duct measures 5 mm.    Images of the kidneys showed no hydronephrosis. There is a lobulated   anechoic mass in the leftkidney which likely represents a bilobed cyst.   It measures 5.1 x 3.4 x 3.2 cm. There is a smaller cyst in the inferior   pole of the left kidney. There is a small cyst in the right kidney which   measures 1.6 cm in greatest dimension.    IMPRESSION:   Cholelithiasis. There is mild gallbladder wall thickening, although the   sonographic Murphy sign was negative. Correlate clinically for   cholecystitis.          Verified By: RGregor Hams M.D., MD   Assessment/Plan:  Assessment/Plan:   Assessment Acute pancreatitis, improving. Biliary obstruction, ? secondary to sludge.  S/P biliary stent placement with improvement in bilirubin.    Plan Full liquid diet. Will follow.   Electronic Signatures: Jill Side (MD)  (Signed 18-Sep-13 11:50)  Authored: Chief Complaint, VITAL SIGNS/ANCILLARY NOTES, Brief Assessment, Lab Results, Radiology Results, Assessment/Plan   Last Updated: 18-Sep-13 11:50 by Jill Side (MD)

## 2015-03-09 NOTE — H&P (Signed)
PATIENT NAME:  Gregory [name], Avary A MR#:  [id number] DATE OF BIRTH:  Apr 03, 1932  DATE OF ADMISSION:  08/04/2012  PRIMARY CARE PHYSICIAN: [facility]  CHIEF COMPLAINT: Epigastric pain and vomiting started a few hours ago.   HISTORY OF PRESENT ILLNESS: [name] is a 81 year old Caucasian male with history of coronary artery disease, pacemaker insertion, mild congestive heart failure, system hypertension, hyperlipidemia. The patient was doing well until a few hours ago when he developed epigastric pain described as pressure-like feeling like a knot or a fist with severity reaching 5 to a scale of 10. It lasted about 1-1/2 hour then eased off but did not subside completely, associated with a few episodes of vomiting. The patient is a poor historian and it was very difficult to obtain information from him. He reports mild shortness of breath, little diarrhea. No fever. No chills. No syncope. Evaluation here at the Emergency Department revealed evidence of new finding of renal failure. His creatinine is up to 1.8. There is elevation of liver enzymes and bilirubin. There is mild leukocytosis. Patient was admitted for further evaluation and management.    REVIEW OF SYSTEMS: CONSTITUTIONAL: Denies any fever. No chills. No fatigue. EYES: No blurring of vision. No double vision. ENT: No hearing impairment. No sore throat. No dysphagia. CARDIOVASCULAR: No chest pain. Reports mild shortness of breath. Patient is unreliable in history taking. No syncope. RESPIRATORY: No cough. No sputum production. No chest pain. No hemoptysis. GASTROINTESTINAL: Refers to the epigastric and mid abdominal area as the area of the pain, reported vomiting without hematemesis. No coffee-ground emesis. No melena. No hematochezia. GENITOURINARY: No dysuria. No frequency of urination. MUSCULOSKELETAL: No joint pain or swelling. No muscular pain or swelling. INTEGUMENTARY: No skin rash. No ulcers. NEUROLOGY: No focal weakness. No seizure  activity. No headache. PSYCHIATRY: No anxiety. No depression. ENDOCRINE: No polyuria or polydipsia. No heat or cold intolerance.   PAST MEDICAL HISTORY:  1. History of coronary artery disease, status post coronary artery bypass graft.  2. History of mild congestive heart failure with ejection fraction of 45% to 50% and diastolic dysfunction.  3. History of anemia secondary to multiple AV malformation and iron deficiency anemia.  4. History of pacemaker insertion.  5. Systemic hypertension. 6. Hyperlipidemia. 7. Benign prostatic hypertrophy.   PAST SURGICAL HISTORY:  1. Coronary artery bypass graft in 2008.  2. He had cardiac catheterization in November 2011.  3. Pacemaker placement.  4. Hemorrhoidectomy.  5. Laser eye surgery.   SOCIAL HABITS: Ex-chronic smoker. He has history of 30 pack-year smoking and he quit in 1989. He now does not drink alcohol but he quit drinking alcohol more than 30 years ago. No history of other drug abuse.   SOCIAL HISTORY: He lives at home alone. He is a retired [employer]at[employer]ic. He used to work with the [employer]. Patient lives at home alone as I stated and his son checks on him who lives close to his home.   FAMILY HISTORY: His mother had gastric cancer. A brother had lung cancer. A sister had coronary artery disease.   ADMISSION MEDICATIONS:  1. Tamsulosin 0.4 mg once a day.  2. Omeprazole 20 mg once a day. 3. Metoprolol 25 mg twice a day.  4. Magnesium oxide 400 mg once a day. 5. Lasix 20 mg a day.  6. Ferrous sulfate 325 mg once a day.  7. Colace 100 mg twice a day.  8. Senna 1 tablet a day as needed.  9. Aspirin 81 mg a day.  10. Altace 10 mg two a day.   ALLERGIES: Penicillin causing hives. Also has records report  cortisone. The patient cannot tell me what kind of reaction.     PHYSICAL EXAMINATION:  VITAL SIGNS: Blood pressure 96/44, respiratory rate 18, pulse 64, temperature 97.5, oxygen saturation 96%.   GENERAL APPEARANCE: Elderly male laying in  bed in no acute distress.   HEAD AND NECK: No pallor. No icterus. No cyanosis.   ENT: Hearing was normal. Nasal mucosa, lips, tongue were normal.   EYES: Normal eyelids and conjunctivae. Left eye pupil about 4 mm, could not detect reactivity to light. He is blind in the right eye.   NECK: Supple. Trachea at midline. No thyromegaly. No cervical lymphadenopathy. No masses.   HEART: Normal S1, S2. No S3 or S4. No murmur. No gallop. No carotid bruits.   RESPIRATORY: Normal breathing pattern without use of accessory muscles. No rales. No wheezing.   ABDOMEN: Soft. There is epigastric tenderness upon moderate palpation. He is also tender at the right upper and left upper quadrant as well. No hepatosplenomegaly. No masses. No hernias.   SKIN: No ulcers. No subcutaneous nodules.   MUSCULOSKELETAL: No joint swelling. No clubbing.   NEUROLOGIC: Cranial nerves II through XII are intact. No focal motor deficit. He eye squint and he is blind in the right eye. This is an old finding.   PSYCHIATRIC: Patient is alert, oriented to place and people, disoriented to time or he does not know. Mood and affect were flat.   LABORATORY, DIAGNOSTIC, AND RADIOLOGICAL DATA: Chest x-ray showed pacemaker noted. No effusion. No consolidation. EKG showed electronic pacing at rate of 69 per minute. Serum glucose 84. B-type natriuretic peptide or BNP is 1349, BUN 19, creatinine 1.8. His creatinine was normal in April 2012, was 1.05. Serum sodium 140, potassium 4.1. Bilirubin elevated at 2.4, alkaline phosphatase up at 194, AST 171, ALT 75. Total CPK 156. Troponin 0.03. CBC showed white count of 11,000, hemoglobin 17, hematocrit 49, platelet count 220. Prothrombin time 13. INR 1. APTT 27.   ASSESSMENT:  1. Epigastric pain associated with vomiting. Differential diagnosis will include peptic ulcer disease, gallbladder disease, pancreatitis or a cardiac event.  2. Elevated liver enzymes and bilirubin.  3. Acute renal  failure.  4.   Hypotension. 5. Mild leukocytosis.  6. History of coronary disease status post coronary artery bypass graft.  7. Congestive heart failure which is stable on this admission, ejection fraction is 45% to 50% with diastolic dysfunction.   OTHER MEDICAL PROBLEMS:  1. History of iron deficiency anemia secondary to multiple AV malformations.  2. Systemic hypertension. 3. Hyperlipidemia. 4. Benign prostatic hypertrophy. 5. History of pacemaker insertion.   PLAN: Will admit the patient to the medical floor on telemetry. I will follow up on his cardiac enzymes. Obtain ultrasound of the abdomen. I would like to check his lipase as well. Place the patient on intravenous Protonix and use Zofran p.r.n. for the nausea and vomiting. Gentle IV hydration with normal saline and keep patient n.p.o. Repeat basic metabolic profile tomorrow to follow up on his kidney function and I will also repeat his liver function tests. Since his blood pressure is relatively low I will hold his Lasix. Additionally, I will stop the Altace given the hypotension and also the renal failure. I will also hold the iron sulfate for the time being until he is able to eat and drink. I will continue the rest of his home medications. The patient does not  have a LIVING WILL and his CODE is FULL CODE.   TIME SPENT: Time spent evaluating this patient took more than 55 minutes including reviewing his medical records and also discussion with his son.   ____________________________ Carney Corners. Rudene Re, MD amd:cms D: 08/04/2012 02:23:18 ET T: 08/04/2012 07:16:46 ET  JOB#: [id number] cc: Carney Corners. Rudene Re, MD, <Dictator> [facility] Dala Dock MD ELECTRONICALLY SIGNED 08/04/2012 22:25

## 2015-03-21 NOTE — Discharge Summary (Signed)
PATIENT NAME:  Gregory [name], Gregory [name] MR#:  [id number] DATE OF BIRTH:  03/06/1931  DATE OF ADMISSION:  01/15/2015 DATE OF DISCHARGE:  01/16/2015  PRIMARY CARE PROVIDER: Bobbie Stackwayne D. Callwood, MD     DISCHARGE DIAGNOSES: 1. Orthostatic syncope.  2. Chronic dizziness.  3. Coronary artery disease.  4. Chronic diastolic congestive heart failure.  5. Hypertension.   DISCHARGE MEDICATIONS: 1. Lasix 40 mg daily as needed for swelling.  2. Meclizine 12.5 mg oral 2 times [name] day.  3. Nitroglycerin 0.4 sublingual as needed for chest pain.  4. Allopurinol 100 mg daily.  5. Aspirin 81 mg daily.  6. Docusate sodium 50 mg 2 times [name] day.  7. Ferrous sulfate 325 mg oral once [name] day.  8. Potassium chloride 10 mEq 2 times [name] day.  9. Metoprolol tartrate 25 mg 1/2 tablet 2 times [name] day.  10. Omeprazole 20 mg daily.  11. Ramipril 2.5 mg daily.  12. Tamsulosin 0.4 mg daily.   DISCHARGE INSTRUCTIONS: Regular food, regular consistency. Activity as tolerated. Take time to stand up from sitting or lying position. The patient to follow up with his primary care physician, cardiologist Dr. Juliann Paresallwood, in 1-2 days for [name] stress test.   CONSULTATIONS: Dr. Lady GaryFath with cardiology.   IMAGING STUDIES: Include [name] chest x-ray, portable, which showed nothing acute, had [name] pacemaker in place. Ultrasound carotid Dopplers showed less than 50% stenosis.  Echocardiogram showed ejection fraction of 50% to 55%.   ADMITTING HISTORY AND PHYSICAL: Please see detailed history and physical dictated previously. In brief, an 81 year old male patient with history of chronic dizziness, orthostatic syncope, presents to the hospital complaining of syncope. The patient was admitted to the hospitalist service for rule out of acute coronary syndrome.    HOSPITAL COURSE: The patient was monitored on telemetry floor, was on fall precautions. Had ambulated well, no problems. The patient has had chronic orthostatic syncope hypotension causing syncope. At this  point, his ramipril dose is being changed from 5 mg 2 times [name] day to 2.5 mg once [name] day. Also, had decreased restriction on his salt intake. The patient will follow up with Dr. Juliann Paresallwood. He had an echocardiogram, which showed nothing acute. The patient is scheduled to have [name] stress test in 2 days with Dr. Juliann Paresallwood, which I requested that he keep. Case was discussed with Dr. Lady GaryFath prior to discharge.   Prior to discharge, the patient's lungs sound clear. S1, S2 heard. No edema found. The patient has ambulated well in the hallway.   TIME SPENT ON DAY OF DISCHARGE ON DISCHARGE ACTIVITY: 40 minutes.     ____________________________ Molinda BailiffSrikar R. Deontay Ladnier, MD srs:mw D: 01/16/2015 14:06:27 ET T: 01/16/2015 19:08:43 ET JOB#: [id number]  cc: Wardell HeathSrikar R. Nashaly Dorantes, MD, <Dictator> Dwayne D. Juliann Paresallwood, MD Orie FishermanSRIKAR R Silveria Botz MD ELECTRONICALLY SIGNED 02/02/2015 8:22

## 2015-03-21 NOTE — Op Note (Signed)
PATIENT NAME:  Gregory [name], Gregory [name] MR#:  [id number] DATE OF BIRTH:  03/06/1931  DATE OF PROCEDURE:  12/30/2014  PRIMARY CARE PHYSICIAN:  Dr. Darreld McleanLinda Miles.   PREPROCEDURE DIAGNOSES:  1. Sick sinus syndrome.  2. Elective replacement indication.   PROCEDURE: Dual chamber pacemaker generator change out.   POSTPROCEDURE DIAGNOSIS: Atrial sensing with ventricular pacing.   INDICATION: The patient is an 81 year old gentleman who is status post dual-chamber pacemaker for sick sinus syndrome. Recent pacemaker interrogation revealed that the pacemaker generator was at elective replacement indication. The procedure, risks, benefits, and alternatives of pacemaker generator change out were explained to the patient and informed written consent was obtained.   DESCRIPTION OF PROCEDURE: He was brought to the operating room in [name] fasting state. The left pectoral region was prepped and draped in the usual sterile manner. [name] 6 cm incision was performed over the left pectoral region. The old pacemaker generator was retrieved by electrocautery and blunt dissection. The leads were disconnected to the old pacemaker generator and connected to [name] new dual-chamber rate responsive pacemaker generator (Medtronic Adapta [id number]). The pacemaker pocket was irrigated with gentamicin solution. The new pacemaker generator was positioned in the pocket. The pocket was closed with 2-0 and 4-0 Vicryl, respectively. Steri-Strips and pressure dressing were applied.      ____________________________ Marcina MillardAlexander Hamza Empson, MD ap:bu D: 12/30/2014 13:09:12 ET T: 12/30/2014 19:46:50 ET JOB#: [id number]  cc: Marcina MillardAlexander Karley Pho, MD, <Dictator> Marcina MillardALEXANDER Kendi Defalco MD ELECTRONICALLY SIGNED 01/12/2015 14:37

## 2015-03-21 NOTE — H&P (Signed)
PATIENT NAME:  Gregory [name], Gregory [name] MR#:  [id number] DATE OF BIRTH:  03/06/1931  REFERRING PHYSICIAN: Coolidge Breeze, MD  PRIMARY CARE PHYSICIAN: [facility]  PRIMARY CARDIOLOGIST: Dwayne D. Callwood, MD  CHIEF COMPLAINT: Passed out in the yard.   HISTORY OF PRESENT ILLNESS: An 81 year old Caucasian male with a history of hypertension, coronary artery disease/myocardial infarction, status post CABG, history of congestive heart failure, status post permanent pacemaker with a recent change on 12/30/2014, hyperlipidemia, benign prostatic hypertrophy, chronic dizziness and gout was brought to the Emergency Room with the complaint of an episode of passing out while he was standing in his yard this morning. The patient states that he just went outside to his yard and had felt some dizziness and following which, he passed out and he went down onto the ground. Does not recall any episode of any chest pain prior to the event. No history of any palpitations also. According to and the patient's son, who is with the patient at this time, the patient regained his consciousness spontaneously before the EMS arrived. EMS brought the patient to the Emergency Room for further evaluation. The patient's son stated that the patient not sure about whether he had any chest pain, but he denies any chest pain after coming to the Emergency Room. No associated shortness of breath. No nausea, vomiting, diarrhea. No history of any recent illness or cough or fever. No urinary symptoms. In the Emergency Room, the patient was evaluated by the ED physician and was found to be with stable vitals and stable cardiovascular and neuro examination and a workup was essentially unremarkable with an EKG showing a pacemaker rhythm and a chest x-ray was unremarkable for any acute pathology. His lab work was also essentially normal, except for elevated BNP of 1068. The patient is now resting comfortably in the bed and denies any complaints such as  chest pain, shortness of breath, palpitations or dizziness.   PAST MEDICAL HISTORY:  1. Hypertension.  2. History of coronary artery disease/myocardial infarction, status post coronary artery bypass grafting.  3. Hyperlipidemia.  4. Congestive heart failure.  5. Status post permanent pacemaker, initially installed in 2002, which was recently changed on 12/30/2014.  6. Benign prostatic hypertrophy.  7. Chronic dizziness.  8. Gout.   PAST SURGICAL HISTORY:  1. Coronary artery bypass grafting.  2. Cholecystectomy.  3. Cataract surgery.  4. Surgery for some orthopedic problem.   ALLERGIES:  1. PENICILLIN.  2. CORTISONE.   FAMILY HISTORY: Significant for grandfather with coronary artery disease.   SOCIAL HISTORY: He is widowed, lives at home and with his son and denies any history of smoking, alcohol and substance abuse.    HOME MEDICATIONS:  1. Albuterol 100 mg tablet 1 tablet orally once a day.  2. Aspirin 81 mg 1 tablet orally once a day.  3. Docusate sodium 150 mg oral capsule 1 capsule 2 times a day.  4. Ferrous sulfate 325 mg 1 tablet orally once a day.  5. Furosemide 40 mg 1 tablet orally once a day as needed for swelling.  6. Meclizine 12.5 mg tablet 1 tablet orally 2 times a day.  7. Metoprolol tartrate 25 mg tablet, 0.5 mg tablet orally 2 times a day.  8. Nitroglycerin 0.4 mg sublingual tablet as needed for chest pain.  9. Omeprazole 20 mg tablet 1 tablet orally once a day.  10. Potassium chloride 10 mEq oral tablet extended release half tablet 2 times a day.  11. Ramipril 5 mg 1  capsule orally 2 times a day.  12. Tamsulosin 0.4 mg capsule 1 capsule orally once at bedtime.   REVIEW OF SYSTEMS:  CONSTITUTIONAL: Negative for fever, chills. No fatigue. No generalized weakness.  EYES: Negative for blurred vision, double vision. No pain. No redness. No discharge.   EARS, NOSE, AND THROAT: Negative for tinnitus, ear pain, hearing loss, epistaxis, nasal discharge, or  difficulty swallowing.  RESPIRATORY: Negative for cough, wheezing, dyspnea, hemoptysis, or painful respiration.  CARDIOVASCULAR: Negative for palpitations. He did have dizziness following syncopal episode with loss of consciousness as mentioned in history of present illness. Not sure if he any chest discomfort, but denies any chest pain at this time. No orthopnea. No pedal edema.  GASTROINTESTINAL: Negative for nausea, vomiting, diarrhea, constipation, abdominal pain, hematemesis, melena, or GERD symptoms.  GENITOURINARY: Negative for dysuria, frequency, urgency. ENDOCRINE: Negative for polyuria, nocturia, heat or cold intolerance.  HEMATOLOGIC AND LYMPHATIC: Negative for anemia, easy bruising or bleeding.  INTEGUMENTARY: Negative for acne, skin rash, or lesions.  MUSCULOSKELETAL: No neck or back pain. History of gout, stable on Allopurinol.  NEUROLOGICAL: No focal weakness or numbness. No history of any CVA, transient ischemic attack, or seizure disorder.  PSYCHIATRIC: Negative for anxiety, insomnia or depression.   PHYSICAL EXAMINATION:  VITAL SIGNS: Temperature 97.3 degrees Fahrenheit, pulse rate 68 per minute, respirations 18, blood pressure 124/60, oxygen saturation is 99% on room air.  GENERAL: Well-developed, well-nourished, alert, in no acute distress, comfortably resting in the bed.  HEAD: Atraumatic, normocephalic.  EYES: Pupils are equal, reactive to light and accommodation. No conjunctival pallor. No icterus. Extraocular movements intact.  NOSE: No drainage. No lesions.  EARS: No drainage. No external lesions.  Oral cavity: No mucosal lesions. No exudates.  NECK: Supple. No JVD. No thyromegaly. No carotid bruit. Range of motion of neck within normal limits.  RESPIRATORY: Good respiratory effort. Not using accessory muscles of respiration. Bilateral vesicular breath sounds and no rales or rhonchi.  CARDIOVASCULAR: S1, S2 regular. No murmurs, gallops, or clicks. Pulses equal. No  peripheral edema.  GASTROINTESTINAL: Abdomen soft, obese, and nontender. No hepatosplenomegaly. No masses. No rigidity. No guarding. Bowel sounds present and equal in all 4 quadrants.  GENITOURINARY: Deferred.  MUSCULOSKELETAL: No joint tenderness or effusion. Range of motion is adequate. Strength and tone equal bilaterally.  SKIN: Inspection within normal limits. No obvious wounds.  LYMPH : No adenopathy.  VASCULAR: Good dorsalis pedis and posterior tibial pulses.  NEUROLOGIC: Alert, awake, and oriented x3. Cranial nerves II through XII grossly intact. No sensory deficit. Motor strength 5/5 in both upper and lower extremities. Deep tendon reflexes 2+ bilaterally and symmetrical. Plantars downgoing.  PSYCHIATRIC: Alert, awake, and oriented x3. Judgment and insight are adequate. Memory and mood within normal limits.   ANCILLARY DATA:  LABORATORY DATA: Serum glucose 92, BNP 1068, BUN 20, creatinine 1.18, sodium 142, potassium 4.0, chloride 108, bicarbonate 29, total calcium 9.0. Troponin 0.04. WBC 5.4, hemoglobin 15.2, hematocrit 47.4, platelet count 145.  CHEST X-RAY: No acute abnormality noted.  EKG: Pacemaker rhythm.    ASSESSMENT AND PLAN: An 81 year old Caucasian male with a history of hypertension,  coronary artery disease/myocardial infarction, status post coronary artery bypass graft, congestive heart failure, status post permanent pacemaker, hyperlipidemia, chronic dizziness, benign prostate hypertrophy and gout presents with a syncopal episode while standing in his yard. The patient has had pacemaker change recently on 12/30/2014.   1. Syncope, rule out arrhythmia, rule out acute coronary syndromes, including vasovagal. PLAN: Admit to telemetry. Cycle cardiac  enzymes. Continue aspirin, beta blocker, statin,  echocardiogram and cardiology consultation for further evaluation.  2. Status post permanent pacemaker, which was originally installed in 2002, recent change of  permanent pacemaker on  12/30/2014. Rule out pacemaker malfunction in view of syncopal episode. PLAN: Telemetry monitoring. Cardiology consultation for further evaluation, including pacemaker evaluation.  3. Coronary artery disease/myocardial infarction, status post coronary artery bypass graft. Not sure of any recent chest pain. No symptoms at present. Troponin x1 negative at this time. EKG: Pacemaker kept rhythm. PLAN: Cycle cardiac enzymes, continue aspirin, beta blocker, nitroglycerin p.r.n., statin, echocardiogram and cardiology consultation.  4. History of congestive heart failure. Stable on current management. Continue same. Check echocardiogram.  5. Chronic dizziness on meclizine. Continue same.  6. Hyperlipidemia, on statin. Continue same.  7. Gout, stable. No acute symptoms continue Allopurinol.  8. Benign prostatic hypertrophy, stable on Flomax, continue same.  9. Deep vein thrombosis prophylaxis, subcutaneous Lovenox.  10. Gastrointestinal prophylaxis: Proton pump inhibitor.   CODE STATUS: Full code.   TIME SPENT: 55 minutes.    ____________________________ Crissie FiguresEdavally N. Brantley Wiley, MD enr:ap D: 01/15/2015 16:17:25 ET T: 01/15/2015 16:46:25 ET JOB#: [id number]  cc: Crissie FiguresEdavally N. Lattie Cervi, MD, <Dictator> [facility] Dwayne D. Juliann Paresallwood, MD Unknown cc  Crissie FiguresEDAVALLY N Donnetta Gillin MD ELECTRONICALLY SIGNED 01/20/2015 8:31

## 2015-03-21 NOTE — Consult Note (Signed)
Brief Consult Note: Diagnosis: syncope.   Patient was seen by consultant.   Recommend further assessment or treatment.   Comments: 81 yo male with history of cad s/p cabg, history of sss s/p recent pacer generator changeout due to ERI who was in his usual state of health working on a tractor this afternoon. He got out of his truck and apparently had a synopal episode. he denied any warning. He is currently paced. No chest pain or sob. Will follow on telemetry and rule out for mi for possible ischemic etiology. Gentle hydration. Probable vaso-vagal episode. Pt did not have his pacer wires modified. Continue with current meds.  Electronic Signatures: Dalia HeadingFath, Kenneth A (MD)  (Signed [phone]-Feb-16 16:51)  Authored: Brief Consult Note   Last Updated: 26-Feb-16 16:51 by Dalia HeadingFath, Kenneth A (MD)

## 2015-09-09 ENCOUNTER — Encounter: Payer: Self-pay | Admitting: Emergency Medicine

## 2015-09-09 ENCOUNTER — Emergency Department
Admission: EM | Admit: 2015-09-09 | Discharge: 2015-09-09 | Disposition: A | Discharge status: Home / Self Care | Payer: Medicare PPO | Attending: Emergency Medicine | Admitting: Emergency Medicine

## 2015-09-09 ENCOUNTER — Emergency Department: Payer: Medicare PPO

## 2015-09-09 DIAGNOSIS — R42 Dizziness and giddiness: Secondary | ICD-10-CM | POA: Diagnosis not present

## 2015-09-09 DIAGNOSIS — Z880 Allergy status to penicillin: Secondary | ICD-10-CM | POA: Insufficient documentation

## 2015-09-09 DIAGNOSIS — Z7982 Long term (current) use of aspirin: Secondary | ICD-10-CM | POA: Diagnosis not present

## 2015-09-09 DIAGNOSIS — Z87891 Personal history of nicotine dependence: Secondary | ICD-10-CM | POA: Diagnosis not present

## 2015-09-09 DIAGNOSIS — I10 Essential (primary) hypertension: Secondary | ICD-10-CM | POA: Diagnosis not present

## 2015-09-09 DIAGNOSIS — R0600 Dyspnea, unspecified: Secondary | ICD-10-CM | POA: Diagnosis not present

## 2015-09-09 DIAGNOSIS — E860 Dehydration: Secondary | ICD-10-CM | POA: Diagnosis not present

## 2015-09-09 DIAGNOSIS — I509 Heart failure, unspecified: Secondary | ICD-10-CM | POA: Insufficient documentation

## 2015-09-09 DIAGNOSIS — R079 Chest pain, unspecified: Secondary | ICD-10-CM | POA: Diagnosis not present

## 2015-09-09 DIAGNOSIS — Z79899 Other long term (current) drug therapy: Secondary | ICD-10-CM | POA: Diagnosis not present

## 2015-09-09 LAB — CBC
HEMATOCRIT: 42.9 % (ref 40.0–52.0)
Hemoglobin: 14.1 g/dL (ref 13.0–18.0)
MCH: 30.8 pg (ref 26.0–34.0)
MCHC: 32.8 g/dL (ref 32.0–36.0)
MCV: 93.9 fL (ref 80.0–100.0)
PLATELETS: 145 10*3/uL — AB (ref 150–440)
RBC: 4.56 MIL/uL (ref 4.40–5.90)
RDW: 14.9 % — AB (ref 11.5–14.5)
WBC: 5.5 10*3/uL (ref 3.8–10.6)

## 2015-09-09 LAB — TROPONIN I
Troponin I: <0.03 ng/mL (ref <0.031)
Troponin I: <0.03 ng/mL (ref <0.031)

## 2015-09-09 LAB — BASIC METABOLIC PANEL
Anion gap: 5 (ref 5–15)
BUN: 24 mg/dL — AB (ref 6–20)
CHLORIDE: 113 mmol/L — AB (ref 101–111)
CO2: 23 mmol/L (ref 22–32)
CREATININE: 1.17 mg/dL (ref 0.61–1.24)
Calcium: 9.1 mg/dL (ref 8.9–10.3)
GFR calc non Af Amer: 56 mL/min — ABNORMAL LOW (ref >60)
GLUCOSE: 120 mg/dL — AB (ref 65–99)
POTASSIUM: 3.9 mmol/L (ref 3.5–5.1)
SODIUM: 141 mmol/L (ref 135–145)

## 2015-09-09 MED ORDER — SODIUM CHLORIDE 0.9 % IV BOLUS (SEPSIS)
1000.0000 mL | Freq: Once | INTRAVENOUS | Status: AC
  Administered 2015-09-09: 1000 mL via INTRAVENOUS

## 2015-09-09 MED ORDER — CYANOCOBALAMIN 1000 MCG/ML IJ SOLN
1000.0000 ug | Freq: Once | INTRAMUSCULAR | Status: AC
  Administered 2015-09-09: 1000 ug via INTRAMUSCULAR
  Filled 2015-09-09 (×2): qty 1

## 2015-09-09 NOTE — Discharge Instructions (Signed)
Dizziness Dizziness is a common problem. It is a feeling of unsteadiness or light-headedness. You may feel like you are about to faint. Dizziness can lead to injury if you stumble or fall. Anyone can become dizzy, but dizziness is more common in older adults. This condition can be caused by a number of things, including medicines, dehydration, or illness. HOME CARE INSTRUCTIONS Taking these steps may help with your condition: Eating and Drinking  Drink enough fluid to keep your urine clear or pale yellow. This helps to keep you from becoming dehydrated. Try to drink more clear fluids, such as water.  Do not drink alcohol.  Limit your caffeine intake if directed by your health care provider.  Limit your salt intake if directed by your health care provider. Activity  Avoid making quick movements.  Rise slowly from chairs and steady yourself until you feel okay.  In the morning, first sit up on the side of the bed. When you feel okay, stand slowly while you hold onto something until you know that your balance is fine.  Move your legs often if you need to stand in one place for a long time. Tighten and relax your muscles in your legs while you are standing.  Do not drive or operate heavy machinery if you feel dizzy.  Avoid bending down if you feel dizzy. Place items in your home so that they are easy for you to reach without leaning over. Lifestyle  Do not use any tobacco products, including cigarettes, chewing tobacco, or electronic cigarettes. If you need help quitting, ask your health care provider.  Try to reduce your stress level, such as with yoga or meditation. Talk with your health care provider if you need help. General Instructions  Watch your dizziness for any changes.  Take medicines only as directed by your health care provider. Talk with your health care provider if you think that your dizziness is caused by a medicine that you are taking.  Tell a friend or a family  member that you are feeling dizzy. If he or she notices any changes in your behavior, have this person call your health care provider.  Keep all follow-up visits as directed by your health care provider. This is important. SEEK MEDICAL CARE IF:  Your dizziness does not go away.  Your dizziness or light-headedness gets worse.  You feel nauseous.  You have reduced hearing.  You have new symptoms.  You are unsteady on your feet or you feel like the room is spinning. SEEK IMMEDIATE MEDICAL CARE IF:  You vomit or have diarrhea and are unable to eat or drink anything.  You have problems talking, walking, swallowing, or using your arms, hands, or legs.  You feel generally weak.  You are not thinking clearly or you have trouble forming sentences. It may take a friend or family member to notice this.  You have chest pain, abdominal pain, shortness of breath, or sweating.  Your vision changes.  You notice any bleeding.  You have a headache.  You have neck pain or a stiff neck.  You have a fever.   This information is not intended to replace advice given to you by your health care provider. Make sure you discuss any questions you have with your health care provider.   Document Released: 05/02/2001 Document Revised: 03/23/2015 Document Reviewed: 11/02/2014 Elsevier Interactive Patient Education 2016 [employer]  Dehydration Dehydration is when you lose more fluids from the body than you take in. Vital organs such  as the kidneys, brain, and heart cannot function without a proper amount of fluids and salt. Any loss of fluids from the body can cause dehydration.  Older adults are at a higher risk of dehydration than younger adults. As we age, our bodies are less able to conserve water and do not respond to temperature changes as well. Also, older adults do not become thirsty as easily or quickly. Because of this, older adults often do not realize they need to increase fluids to avoid  dehydration.  CAUSES   Vomiting.  Diarrhea.  Excessive sweating.  Excessive urination.  Fever.  Certain medicines, such as blood pressure medicines called diuretics.  Poorly controlled blood sugars. SIGNS AND SYMPTOMS  Mild dehydration:  Thirst.  Dry lips.  Slightly dry mouth. Moderate dehydration:  Very dry mouth.  Sunken eyes.  Skin does not bounce back quickly when lightly pinched and released.  Dark urine and decreased urine production.  Decreased tear production.  Headache. Severe dehydration:  Very dry mouth.  Extreme thirst.  Rapid, weak pulse (more than 100 beats per minute at rest).  Cold hands and feet.  Not able to sweat in spite of heat.  Rapid breathing.  Blue lips.  Confusion and lethargy.  Difficulty being awakened.  Minimal urine production.  No tears. DIAGNOSIS  Your health care provider will diagnose dehydration based on your symptoms and your exam. Blood and urine tests will help confirm the diagnosis. The diagnostic evaluation should also identify the cause of dehydration. TREATMENT  Treatment of mild or moderate dehydration can often be done at home by increasing the amount of fluids that you drink. It is best to drink small amounts of fluid more often. Drinking too much at one time can make vomiting worse. Severe dehydration needs to be treated at the hospital. You may be given IV fluids that contain water and electrolytes. HOME CARE INSTRUCTIONS   Ask your health care provider about specific rehydration instructions.  Drink enough fluids to keep your urine clear or pale yellow.  Drink small amounts frequently if you have nausea and vomiting.  Eat as you normally do.  Avoid:  Foods or drinks high in sugar.  Carbonated drinks.  Juice.  Extremely hot or cold fluids.  Drinks with caffeine.  Fatty, greasy foods.  Alcohol.  Tobacco.  Overeating.  Gelatin desserts.  Wash your hands well to avoid spreading  bacteria and viruses.  Only take over-the-counter or prescription medicines for pain, discomfort, or fever as directed by your health care provider.  Ask your health care provider if you should continue all prescribed and over-the-counter medicines.  Keep all follow-up appointments with your health care provider. SEEK MEDICAL CARE IF:  You have abdominal pain, and it increases or stays in one area (localizes).  You have a rash, stiff neck, or severe headache.  You are irritable, sleepy, or difficult to awaken.  You are weak, dizzy, or extremely thirsty.  You have a fever. SEEK IMMEDIATE MEDICAL CARE IF:   You are unable to keep fluids down, or you get worse despite treatment.  You have frequent episodes of vomiting or diarrhea.  You have blood or green matter (bile) in your vomit.  You have blood in your stool, or your stool looks black and tarry.  You have not urinated in 6-8 hours, or you have only urinated a small amount of very dark urine.  You faint. MAKE SURE YOU:   Understand these instructions.  Will watch your condition.  Will  get help right away if you are not doing well or get worse.   This information is not intended to replace advice given to you by your health care provider. Make sure you discuss any questions you have with your health care provider.   Document Released: 01/27/2004 Document Revised: 11/11/2013 Document Reviewed: 07/14/2013 Elsevier Interactive Patient Education 2016 [employer] Inc[employer]  Nonspecific Chest Pain  Chest pain can be caused by many different conditions. There is always a chance that your pain could be related to something serious, such as a heart attack or a blood clot in your lungs. Chest pain can also be caused by conditions that are not life-threatening. If you have chest pain, it is very important to follow up with your health care provider. CAUSES  Chest pain can be caused by:  Heartburn.  Pneumonia or bronchitis.  Anxiety  or stress.  Inflammation around your heart (pericarditis) or lung (pleuritis or pleurisy).  A blood clot in your lung.  A collapsed lung (pneumothorax). It can develop suddenly on its own (spontaneous pneumothorax) or from trauma to the chest.  Shingles infection (varicella-zoster virus).  Heart attack.  Damage to the bones, muscles, and cartilage that make up your chest wall. This can include:  Bruised bones due to injury.  Strained muscles or cartilage due to frequent or repeated coughing or overwork.  Fracture to one or more ribs.  Sore cartilage due to inflammation (costochondritis). RISK FACTORS  Risk factors for chest pain may include:  Activities that increase your risk for trauma or injury to your chest.  Respiratory infections or conditions that cause frequent coughing.  Medical conditions or overeating that can cause heartburn.  Heart disease or family history of heart disease.  Conditions or health behaviors that increase your risk of developing a blood clot.  Having had chicken pox (varicella zoster). SIGNS AND SYMPTOMS Chest pain can feel like:  Burning or tingling on the surface of your chest or deep in your chest.  Crushing, pressure, aching, or squeezing pain.  Dull or sharp pain that is worse when you move, cough, or take a deep breath.  Pain that is also felt in your back, neck, shoulder, or arm, or pain that spreads to any of these areas. Your chest pain may come and go, or it may stay constant. DIAGNOSIS Lab tests or other studies may be needed to find the cause of your pain. Your health care provider may have you take a test called an ambulatory ECG (electrocardiogram). An ECG records your heartbeat patterns at the time the test is performed. You may also have other tests, such as:  Transthoracic echocardiogram (TTE). During echocardiography, sound waves are used to create a picture of all of the heart structures and to look at how blood flows  through your heart.  Transesophageal echocardiogram (TEE).This is a more advanced imaging test that obtains images from inside your body. It allows your health care provider to see your heart in finer detail.  Cardiac monitoring. This allows your health care provider to monitor your heart rate and rhythm in real time.  Holter monitor. This is a portable device that records your heartbeat and can help to diagnose abnormal heartbeats. It allows your health care provider to track your heart activity for several days, if needed.  Stress tests. These can be done through exercise or by taking medicine that makes your heart beat more quickly.  Blood tests.  Imaging tests. TREATMENT  Your treatment depends on what is causing  your chest pain. Treatment may include:  Medicines. These may include:  Acid blockers for heartburn.  Anti-inflammatory medicine.  Pain medicine for inflammatory conditions.  Antibiotic medicine, if an infection is present.  Medicines to dissolve blood clots.  Medicines to treat coronary artery disease.  Supportive care for conditions that do not require medicines. This may include:  Resting.  Applying heat or cold packs to injured areas.  Limiting activities until pain decreases. HOME CARE INSTRUCTIONS  If you were prescribed an antibiotic medicine, finish it all even if you start to feel better.  Avoid any activities that bring on chest pain.  Do not use any tobacco products, including cigarettes, chewing tobacco, or electronic cigarettes. If you need help quitting, ask your health care provider.  Do not drink alcohol.  Take medicines only as directed by your health care provider.  Keep all follow-up visits as directed by your health care provider. This is important. This includes any further testing if your chest pain does not go away.  If heartburn is the cause for your chest pain, you may be told to keep your head raised (elevated) while sleeping.  This reduces the chance that acid will go from your stomach into your esophagus.  Make lifestyle changes as directed by your health care provider. These may include:  Getting regular exercise. Ask your health care provider to suggest some activities that are safe for you.  Eating a heart-healthy diet. A registered dietitian can help you to learn healthy eating options.  Maintaining a healthy weight.  Managing diabetes, if necessary.  Reducing stress. SEEK MEDICAL CARE IF:  Your chest pain does not go away after treatment.  You have a rash with blisters on your chest.  You have a fever. SEEK IMMEDIATE MEDICAL CARE IF:   Your chest pain is worse.  You have an increasing cough, or you cough up blood.  You have severe abdominal pain.  You have severe weakness.  You faint.  You have chills.  You have sudden, unexplained chest discomfort.  You have sudden, unexplained discomfort in your arms, back, neck, or jaw.  You have shortness of breath at any time.  You suddenly start to sweat, or your skin gets clammy.  You feel nauseous or you vomit.  You suddenly feel light-headed or dizzy.  Your heart begins to beat quickly, or it feels like it is skipping beats. These symptoms may represent a serious problem that is an emergency. Do not wait to see if the symptoms will go away. Get medical help right away. Call your local emergency services (911 in the [place] Do not drive yourself to the hospital.   This information is not intended to replace advice given to you by your health care provider. Make sure you discuss any questions you have with your health care provider.   Document Released: 08/16/2005 Document Revised: 11/27/2014 Document Reviewed: 06/12/2014 Elsevier Interactive Patient Education [employer]

## 2015-09-09 NOTE — ED Provider Notes (Signed)
[facility] Emergency Department Provider Note  ____________________________________________  Time seen: 10:25 AM  I have reviewed the triage vital signs and the nursing notes.   HISTORY  Chief Complaint Chest Pain  History Limited by dementia Additional history obtained from son and brother  HPI Gregory [name] is a 81 y.o. male brought to the ED by his family due to episodes of dizziness Lipitor on for years but worse over the last few days. He has a history of a pacemaker followed by Dr. call would, also reported to not be eating or drinking very well due to insufficient appetite over many months. Whenever he leans over and then stands back up he gets dizzy and feels like he is going to pass out. He also had a brief episode of chest pain yesterday which he has an unable to further describe. No chest pain currently. In triage he reported shortness of breath but currently denies that.     Past Medical History  Diagnosis Date  . CHF (congestive heart failure) (HCC)     EF of 45% with diastolic dysfunction, grade I  . History of anemia     secondary to multiple AVMs  . Obstructive sleep apnea   . Coronary artery disease     s/p CABG in 2008 and a recent catheterization which showed no new occlusion   . Hypertension   . Hyperlipidemia   . BPH (benign prostatic hyperplasia)      There are no active problems to display for this patient.    Past Surgical History  Procedure Laterality Date  . Coronary artery bypass graft  2008    x2, negative cath about 8 months ago  . Insert / replace / remove pacemaker    . Refractive surgery    . Hemorrhoid surgery       Current Outpatient Rx  Name  Route  Sig  Dispense  Refill  . allopurinol (ZYLOPRIM) 100 MG tablet   Oral   Take 100 mg by mouth daily.         Marland Kitchen aspirin 81 MG EC tablet   Oral   Take 81 mg by mouth daily.           Marland Kitchen docusate sodium (COLACE) 50 MG capsule   Oral   Take by mouth  every evening.          . donepezil (ARICEPT) 5 MG tablet   Oral   Take 5 mg by mouth at bedtime.         . ferrous sulfate 325 (65 FE) MG tablet   Oral   Take 325 mg by mouth every evening.          . meclizine (ANTIVERT) 12.5 MG tablet   Oral   Take 12.5 mg by mouth 2 (two) times daily.         . metoprolol tartrate (LOPRESSOR) 25 MG tablet   Oral   Take 12.5 mg by mouth 2 (two) times daily.          . nitroGLYCERIN (NITROSTAT) 0.4 MG SL tablet   Sublingual   Place 0.4 mg under the tongue every 5 (five) minutes as needed for chest pain.         Marland Kitchen omeprazole (PRILOSEC) 20 MG capsule   Oral   Take 20 mg by mouth daily.         . potassium chloride SA (K-DUR,KLOR-CON) 20 MEQ tablet   Oral   Take 10 mEq by  mouth 2 (two) times daily.         . ramipril (ALTACE) 5 MG capsule   Oral   Take 5 mg by mouth daily.         . Tamsulosin HCl (FLOMAX) 0.4 MG CAPS   Oral   Take 0.4 mg by mouth daily.              Allergies Cortisone and Penicillins   Family History  Problem Relation Age of Onset  . Coronary artery disease Sister   . Cancer Mother     gastric  . Lung cancer Brother     Social History Social History  Substance Use Topics  . Smoking status: Former Smoker    Types: Cigarettes    Quit date: 11/21/1987  . Smokeless tobacco: Never Used  . Alcohol Use: No    Review of Systems  Constitutional:   No fever or chills. No weight changes Eyes:   No blurry vision or double vision.  ENT:   No sore throat. Cardiovascular:   Positive chest pain. Respiratory:   Positive dyspnea negative cough. Gastrointestinal:   Negative for abdominal pain, vomiting and diarrhea.  No BRBPR or melena. Genitourinary:   Negative for dysuria, urinary retention, bloody urine, or difficulty urinating. Musculoskeletal:   Negative for back pain. No joint swelling or pain. Skin:   Negative for rash. Neurological:   Negative for headaches, focal weakness or  numbness. Occasional dizziness with change of position Psychiatric:  No anxiety or depression.   Endocrine:  No hot/cold intolerance, changes in energy, or sleep difficulty.  10-point ROS otherwise negative.  ____________________________________________   PHYSICAL EXAM:  VITAL SIGNS: ED Triage Vitals  Enc Vitals Group     BP 09/09/15 0940 126/59 mmHg     Pulse Rate 09/09/15 0940 60     Resp 09/09/15 0940 20     Temp 09/09/15 0940 98.2 F (36.8 C)     Temp Source 09/09/15 0940 Oral     SpO2 09/09/15 0940 97 %     Weight 09/09/15 0940 178 lb (80.74 kg)     Height 09/09/15 0940  (1.651 m)     Head Cir --      Peak Flow --      Pain Score 09/09/15 0940 1     Pain Loc --      Pain Edu? --      Excl. in GC? --      Constitutional:   Alert and oriented. Well appearing and in no distress. Eyes:   No scleral icterus. No conjunctival pallor. PERRL. EOMI ENT   Head:   Normocephalic and atraumatic.   Nose:   No congestion/rhinnorhea. No septal hematoma   Mouth/Throat:   Dry mucous membranes, no pharyngeal erythema. No peritonsillar mass. No uvula shift.   Neck:   No stridor. No SubQ emphysema. No meningismus. Hematological/Lymphatic/Immunilogical:   No cervical lymphadenopathy. Cardiovascular:   RRR. Normal and symmetric distal pulses are present in all extremities. No murmurs, rubs, or gallops. Respiratory:   Normal respiratory effort without tachypnea nor retractions. Breath sounds are clear and equal bilaterally. No wheezes/rales/rhonchi. Gastrointestinal:   Soft and nontender. No distention. There is no CVA tenderness.  No rebound, rigidity, or guarding. Genitourinary:   deferred Musculoskeletal:   Nontender with normal range of motion in all extremities. No joint effusions.  No lower extremity tenderness.  No edema. Neurologic:   Normal speech and language.  CN 2-10 normal. Motor grossly intact. No  pronator drift.  Normal gait. No gross focal neurologic  deficits are appreciated.  Skin:    Skin is warm, dry and intact. No rash noted.  No petechiae, purpura, or bullae. Poor skin turgor Psychiatric:   Mood and affect are normal. Speech and behavior are normal. Patient exhibits appropriate insight and judgment.  ____________________________________________    LABS (pertinent positives/negatives) (all labs ordered are listed, but only abnormal results are displayed) Labs Reviewed  BASIC METABOLIC PANEL - Abnormal; Notable for the following:    Chloride 113 (*)    Glucose, Bld 120 (*)    BUN 24 (*)    GFR calc non Af Amer 56 (*)    All other components within normal limits  CBC - Abnormal; Notable for the following:    RDW 14.9 (*)    Platelets 145 (*)    All other components within normal limits  TROPONIN I  TROPONIN I   ____________________________________________   EKG  Interpreted by me Dual-chamber paced rhythm, rate of 70. No Sgarbossa criteria  ____________________________________________    RADIOLOGY  Chest x-ray unremarkable  ____________________________________________   PROCEDURES   ____________________________________________   INITIAL IMPRESSION / ASSESSMENT AND PLAN / ED COURSE  Pertinent labs & imaging results that were available during my care of the patient were reviewed by me and considered in my medical decision making (see chart for details).  Patient presents with episodes of dizziness that is likely a combination of mild dehydration, Valsalva with leaning over decreasing venous return, and lack of ability to mount a compensatory tachycardia due to his chronic cardiac disease or prior to pacemaker. Denies any symptoms at the present time, well-appearing. Labs EKG chest x-ray and troponin 2 are unremarkable. The patient was given his routine B12 injection in the emergency department because he was missing his neurology appointment. We'll have him follow up with primary care and cardiology within the  next week. Liter of saline here in the emergency department for rehydration. Low suspicion of ACS PE TAD, no evidence of stroke or intracranial hemorrhage.     ____________________________________________   FINAL CLINICAL IMPRESSION(S) / ED DIAGNOSES  Final diagnoses:  Dizziness  Mild dehydration  Nonspecific chest pain      Sharman CheekPhillip Khamil Lamica, MD 09/09/15 1345

## 2015-09-09 NOTE — ED Notes (Signed)
Pt to ed with c/o chest pain intermittently x several days,  Pt states dizzy and weak when he tries to bend over or go from sitting to standing, reports pain worse with bending over.  Also c/o sob and nausea.  Denies vomiting.

## 2015-10-05 ENCOUNTER — Encounter: Payer: Self-pay | Admitting: Internal Medicine

## 2015-10-05 ENCOUNTER — Emergency Department: Payer: Medicare PPO

## 2015-10-05 ENCOUNTER — Observation Stay
Admission: EM | Admit: 2015-10-05 | Discharge: 2015-10-08 | Disposition: A | Discharge status: Home / Self Care | Payer: Medicare PPO | Attending: Internal Medicine | Admitting: Internal Medicine

## 2015-10-05 DIAGNOSIS — D72819 Decreased white blood cell count, unspecified: Secondary | ICD-10-CM

## 2015-10-05 DIAGNOSIS — Z880 Allergy status to penicillin: Secondary | ICD-10-CM | POA: Insufficient documentation

## 2015-10-05 DIAGNOSIS — Z7982 Long term (current) use of aspirin: Secondary | ICD-10-CM | POA: Diagnosis not present

## 2015-10-05 DIAGNOSIS — Z951 Presence of aortocoronary bypass graft: Secondary | ICD-10-CM | POA: Diagnosis not present

## 2015-10-05 DIAGNOSIS — N400 Enlarged prostate without lower urinary tract symptoms: Secondary | ICD-10-CM | POA: Diagnosis not present

## 2015-10-05 DIAGNOSIS — Z87891 Personal history of nicotine dependence: Secondary | ICD-10-CM | POA: Insufficient documentation

## 2015-10-05 DIAGNOSIS — R42 Dizziness and giddiness: Secondary | ICD-10-CM | POA: Diagnosis not present

## 2015-10-05 DIAGNOSIS — I6523 Occlusion and stenosis of bilateral carotid arteries: Secondary | ICD-10-CM | POA: Insufficient documentation

## 2015-10-05 DIAGNOSIS — Z800 Family history of malignant neoplasm of digestive organs: Secondary | ICD-10-CM | POA: Insufficient documentation

## 2015-10-05 DIAGNOSIS — R55 Syncope and collapse: Principal | ICD-10-CM | POA: Diagnosis present

## 2015-10-05 DIAGNOSIS — I4510 Unspecified right bundle-branch block: Secondary | ICD-10-CM | POA: Insufficient documentation

## 2015-10-05 DIAGNOSIS — I5042 Chronic combined systolic (congestive) and diastolic (congestive) heart failure: Secondary | ICD-10-CM

## 2015-10-05 DIAGNOSIS — Z79899 Other long term (current) drug therapy: Secondary | ICD-10-CM | POA: Insufficient documentation

## 2015-10-05 DIAGNOSIS — E785 Hyperlipidemia, unspecified: Secondary | ICD-10-CM | POA: Diagnosis not present

## 2015-10-05 DIAGNOSIS — R001 Bradycardia, unspecified: Secondary | ICD-10-CM | POA: Diagnosis present

## 2015-10-05 DIAGNOSIS — N189 Chronic kidney disease, unspecified: Secondary | ICD-10-CM | POA: Insufficient documentation

## 2015-10-05 DIAGNOSIS — I10 Essential (primary) hypertension: Secondary | ICD-10-CM | POA: Diagnosis present

## 2015-10-05 DIAGNOSIS — I499 Cardiac arrhythmia, unspecified: Secondary | ICD-10-CM | POA: Diagnosis present

## 2015-10-05 DIAGNOSIS — R410 Disorientation, unspecified: Secondary | ICD-10-CM | POA: Insufficient documentation

## 2015-10-05 DIAGNOSIS — Z950 Presence of cardiac pacemaker: Secondary | ICD-10-CM | POA: Diagnosis not present

## 2015-10-05 DIAGNOSIS — R000 Tachycardia, unspecified: Secondary | ICD-10-CM

## 2015-10-05 DIAGNOSIS — Z801 Family history of malignant neoplasm of trachea, bronchus and lung: Secondary | ICD-10-CM | POA: Diagnosis not present

## 2015-10-05 DIAGNOSIS — I495 Sick sinus syndrome: Secondary | ICD-10-CM | POA: Insufficient documentation

## 2015-10-05 DIAGNOSIS — D649 Anemia, unspecified: Secondary | ICD-10-CM | POA: Insufficient documentation

## 2015-10-05 DIAGNOSIS — Z9889 Other specified postprocedural states: Secondary | ICD-10-CM | POA: Insufficient documentation

## 2015-10-05 DIAGNOSIS — I130 Hypertensive heart and chronic kidney disease with heart failure and stage 1 through stage 4 chronic kidney disease, or unspecified chronic kidney disease: Secondary | ICD-10-CM | POA: Insufficient documentation

## 2015-10-05 DIAGNOSIS — I2510 Atherosclerotic heart disease of native coronary artery without angina pectoris: Secondary | ICD-10-CM | POA: Diagnosis present

## 2015-10-05 DIAGNOSIS — G4733 Obstructive sleep apnea (adult) (pediatric): Secondary | ICD-10-CM | POA: Diagnosis not present

## 2015-10-05 DIAGNOSIS — Z885 Allergy status to narcotic agent status: Secondary | ICD-10-CM | POA: Diagnosis not present

## 2015-10-05 DIAGNOSIS — D61818 Other pancytopenia: Secondary | ICD-10-CM | POA: Diagnosis not present

## 2015-10-05 DIAGNOSIS — I639 Cerebral infarction, unspecified: Secondary | ICD-10-CM

## 2015-10-05 DIAGNOSIS — R110 Nausea: Secondary | ICD-10-CM | POA: Diagnosis not present

## 2015-10-05 DIAGNOSIS — I429 Cardiomyopathy, unspecified: Secondary | ICD-10-CM | POA: Diagnosis not present

## 2015-10-05 DIAGNOSIS — I959 Hypotension, unspecified: Secondary | ICD-10-CM

## 2015-10-05 DIAGNOSIS — Z8249 Family history of ischemic heart disease and other diseases of the circulatory system: Secondary | ICD-10-CM | POA: Diagnosis not present

## 2015-10-05 DIAGNOSIS — R0602 Shortness of breath: Secondary | ICD-10-CM | POA: Diagnosis not present

## 2015-10-05 DIAGNOSIS — I4891 Unspecified atrial fibrillation: Secondary | ICD-10-CM

## 2015-10-05 HISTORY — DX: Chronic systolic (congestive) heart failure: I50.22

## 2015-10-05 LAB — CBC WITH DIFFERENTIAL/PLATELET
BASOS ABS: 0.1 10*3/uL (ref 0–0.1)
BASOS PCT: 1 %
Eosinophils Absolute: 0.1 10*3/uL (ref 0–0.7)
Eosinophils Relative: 3 %
HEMATOCRIT: 41.5 % (ref 40.0–52.0)
HEMOGLOBIN: 13.6 g/dL (ref 13.0–18.0)
LYMPHS PCT: 25 %
Lymphs Abs: 1.2 10*3/uL (ref 1.0–3.6)
MCH: 31.1 pg (ref 26.0–34.0)
MCHC: 32.8 g/dL (ref 32.0–36.0)
MCV: 94.9 fL (ref 80.0–100.0)
MONO ABS: 0.6 10*3/uL (ref 0.2–1.0)
Monocytes Relative: 13 %
NEUTROS ABS: 2.7 10*3/uL (ref 1.4–6.5)
NEUTROS PCT: 58 %
Platelets: 131 10*3/uL — ABNORMAL LOW (ref 150–440)
RBC: 4.38 MIL/uL — AB (ref 4.40–5.90)
RDW: 15.2 % — AB (ref 11.5–14.5)
WBC: 4.7 10*3/uL (ref 3.8–10.6)

## 2015-10-05 LAB — COMPREHENSIVE METABOLIC PANEL
ALT: 16 U/L — ABNORMAL LOW (ref 17–63)
ANION GAP: 6 (ref 5–15)
AST: 24 U/L (ref 15–41)
Albumin: 3.5 g/dL (ref 3.5–5.0)
Alkaline Phosphatase: 89 U/L (ref 38–126)
BILIRUBIN TOTAL: 0.6 mg/dL (ref 0.3–1.2)
BUN: 24 mg/dL — ABNORMAL HIGH (ref 6–20)
CHLORIDE: 108 mmol/L (ref 101–111)
CO2: 24 mmol/L (ref 22–32)
Calcium: 9.1 mg/dL (ref 8.9–10.3)
Creatinine, Ser: 1.24 mg/dL (ref 0.61–1.24)
GFR calc Af Amer: >60 mL/min (ref >60)
GFR, EST NON AFRICAN AMERICAN: 52 mL/min — AB (ref >60)
Glucose, Bld: 96 mg/dL (ref 65–99)
POTASSIUM: 4.5 mmol/L (ref 3.5–5.1)
Sodium: 138 mmol/L (ref 135–145)
TOTAL PROTEIN: 6.9 g/dL (ref 6.5–8.1)

## 2015-10-05 LAB — TROPONIN I

## 2015-10-05 NOTE — ED Provider Notes (Signed)
[facility] Emergency Department Provider Note  ____________________________________________  Time seen: Approximately 10:36 PM  I have reviewed the triage vital signs and the nursing notes.   HISTORY  Chief Complaint Dizziness    HPI Gregory [name] is a 81 y.o. male who was at home today and got dizzy at about 6 PM. Patient is pale and nauseated short of breath but not sweating. She has a history of A. fib. He has a history of pacemaker being placed. The dizziness with lightheadedness not vertigo per what he reports. On arrival in the emergency room patient with his pacemaker and was bradycardic at a rate of 33 with no obvious pacer spikes. Patient then went into A. fib with rapid ventricular response on the monitor up to about 120 EKG showed a rate of 122 with right bundle branch block with some ST elevation inferiorly but that was present on old EKG. Discussed with Dr. Ihor AustinPfaff and provided him with images of the 3 EKGs here in the ER and they're 1. He agrees with the ST elevation was present on previous EKG did not think that there was any acute MI going on. Patient also denied any chest pain or tightness. At the time of the third EKG at 1940 hrs. the pacemaker finally kicked in and had a fully paced rhythm at a rate of 111.   Past Medical History  Diagnosis Date  . Chronic systolic CHF (congestive heart failure) (HCC)     EF of 45% with diastolic dysfunction, grade I  . History of anemia     secondary to multiple AVMs  . Obstructive sleep apnea   . Coronary artery disease     s/p CABG in 2008 and a recent catheterization which showed no new occlusion   . Hypertension   . Hyperlipidemia   . BPH (benign prostatic hyperplasia)     Patient Active Problem List   Diagnosis Date Noted  . Near syncope 10/05/2015  . Arrhythmia 10/05/2015  . Chronic systolic CHF (congestive heart failure) (HCC) 10/05/2015  . CAD (coronary artery disease) 10/05/2015  . HTN  (hypertension) 10/05/2015  . HLD (hyperlipidemia) 10/05/2015  . BPH (benign prostatic hyperplasia) 10/05/2015    Past Surgical History  Procedure Laterality Date  . Coronary artery bypass graft  2008    x2, negative cath about 8 months ago  . Insert / replace / remove pacemaker    . Refractive surgery    . Hemorrhoid surgery      Current Outpatient Rx  Name  Route  Sig  Dispense  Refill  . allopurinol (ZYLOPRIM) 100 MG tablet   Oral   Take 100 mg by mouth daily.         Marland Kitchen. aspirin 81 MG EC tablet   Oral   Take 81 mg by mouth daily.           Marland Kitchen. docusate sodium (COLACE) 50 MG capsule   Oral   Take by mouth every evening.          . donepezil (ARICEPT) 5 MG tablet   Oral   Take 5 mg by mouth at bedtime.         . ferrous sulfate 325 (65 FE) MG tablet   Oral   Take 325 mg by mouth every evening.          . meclizine (ANTIVERT) 12.5 MG tablet   Oral   Take 12.5 mg by mouth 2 (two) times daily.         .Marland Kitchen  metoprolol tartrate (LOPRESSOR) 25 MG tablet   Oral   Take 12.5 mg by mouth 2 (two) times daily.          . nitroGLYCERIN (NITROSTAT) 0.4 MG SL tablet   Sublingual   Place 0.4 mg under the tongue every 5 (five) minutes as needed for chest pain.         Marland Kitchen omeprazole (PRILOSEC) 20 MG capsule   Oral   Take 20 mg by mouth daily.         . potassium chloride SA (K-DUR,KLOR-CON) 20 MEQ tablet   Oral   Take 10 mEq by mouth 2 (two) times daily.         . ramipril (ALTACE) 5 MG capsule   Oral   Take 5 mg by mouth every morning.          . Tamsulosin HCl (FLOMAX) 0.4 MG CAPS   Oral   Take 0.4 mg by mouth daily after supper.          . vitamin B-12 (CYANOCOBALAMIN) 1000 MCG tablet   Oral   Take 1,000 mcg by mouth daily.           Allergies Cortisone and Penicillins  Family History  Problem Relation Age of Onset  . Coronary artery disease Sister   . Cancer Mother     gastric  . Lung cancer Brother     Social History Social  History  Substance Use Topics  . Smoking status: Former Smoker    Types: Cigarettes    Quit date: 11/21/1987  . Smokeless tobacco: Never Used  . Alcohol Use: No    Review of Systems Constitutional: No fever/chills Eyes: No visual changes. ENT: No sore throat. Cardiovascular: Denies chest pain. Respiratory: Denies shortness of breath. Gastrointestinal: No abdominal pain.  No diarrhea.  No constipation. Genitourinary: Negative for dysuria. Musculoskeletal: Negative for back pain. Skin: Negative for rash. Neurological: Negative for headaches, focal weakness or numbness.  10-point ROS otherwise negative.  ____________________________________________   PHYSICAL EXAM:  VITAL SIGNS: ED Triage Vitals  Enc Vitals Group     BP 10/05/15 1913 124/81 mmHg     Pulse Rate 10/05/15 1913 35     Resp 10/05/15 1913 18     Temp 10/05/15 1913 97.9 F (36.6 C)     Temp Source 10/05/15 1913 Oral     SpO2 10/05/15 1913 98 %     Weight 10/05/15 1913 179 lb (81.194 kg)     Height --      Head Cir --      Peak Flow --      Pain Score 10/05/15 1913 0     Pain Loc --      Pain Edu? --      Excl. in GC? --    Constitutional: Alert and oriented. Well appearing and in no acute distress. Eyes: Conjunctivae are normal. PERRL. EOMI. Head: Atraumatic. Nose: No congestion/rhinnorhea. Mouth/Throat: Mucous membranes are moist.  Oropharynx non-erythematous. Neck: No stridor. Cardiovascular: Normal rate, regular rhythm. Grossly normal heart sounds.  Good peripheral circulation. Respiratory: Normal respiratory effort.  No retractions. Lungs CTAB. Gastrointestinal: Soft and nontender. No distention. No abdominal bruits. No CVA tenderness. Musculoskeletal: No lower extremity tenderness nor edema.  No joint effusions. Neurologic:  Normal speech and language. No gross focal neurologic deficits are appreciated. No gait instability. Skin:  Skin is warm, dry and intact. No rash  noted.   ____________________________________________   LABS (all labs ordered are listed, but only  abnormal results are displayed)  Labs Reviewed  COMPREHENSIVE METABOLIC PANEL - Abnormal; Notable for the following:    BUN 24 (*)    ALT 16 (*)    GFR calc non Af Amer 52 (*)    All other components within normal limits  CBC WITH DIFFERENTIAL/PLATELET - Abnormal; Notable for the following:    RBC 4.38 (*)    RDW 15.2 (*)    Platelets 131 (*)    All other components within normal limits  TROPONIN I   ____________________________________________  EKG EKG #1 A. fib at a rate of 118 rapid ventricular response and occasional pauses right bundle-branch block and some ST elevation present inferiorly and in V4 which was present on previous EKG EKG #2 similar to #1 but with a rate of 114 EKG #3 fully paced rhythm at a rate of 111 also similar to the previous 2 EKGs only now with pacer spikes ____________________________________________  RADIOLOGY  Read by radiology as stable cardiomegaly ____________________________________________   PROCEDURES   ____________________________________________   INITIAL IMPRESSION / ASSESSMENT AND PLAN / ED COURSE  Pertinent labs & imaging results that were available during my care of the patient were reviewed by me and considered in my medical decision making (see chart for details).   ____________________________________________   FINAL CLINICAL IMPRESSION(S) / ED DIAGNOSES  Final diagnoses:  Near syncope  Bradycardia  Atrial fibrillation with rapid ventricular response (HCC)      Arnaldo Natal, MD 10/05/15 2244

## 2015-10-05 NOTE — H&P (Signed)
[facility] - [place] at [facility]   PATIENT NAME: Gregory [name]    MR#:  [id number]  DATE OF BIRTH:  03/06/1931  DATE OF ADMISSION:  10/05/2015  PRIMARY CARE PHYSICIAN: Leanna Sato, MD   REQUESTING/REFERRING PHYSICIAN: Darnelle Catalan, M.D.  CHIEF COMPLAINT:   Chief Complaint  Patient presents with  . Dizziness    HISTORY OF PRESENT ILLNESS:  Gregory [name]  is a 81 y.o. male who presents with near syncopal episode. Patient has been having syncopal and near-syncopal episodes for some time. He is status post pacemaker implant. On arrival to the ED here today he is noted to be having some pacemaker aberrancies. His initial EKG did not show paced rhythm, his heart rate then dropped to the 30s, then picked up to the 120s, then settled back to a paced rhythm in the low 100s. He now appears to be in an irregular rate consistent paced rhythm. Hospitalists were called for admission for the same.  PAST MEDICAL HISTORY:   Past Medical History  Diagnosis Date  . Chronic systolic CHF (congestive heart failure) (HCC)     EF of 45% with diastolic dysfunction, grade I  . History of anemia     secondary to multiple AVMs  . Obstructive sleep apnea   . Coronary artery disease     s/p CABG in 2008 and a recent catheterization which showed no new occlusion   . Hypertension   . Hyperlipidemia   . BPH (benign prostatic hyperplasia)     PAST SURGICAL HISTORY:   Past Surgical History  Procedure Laterality Date  . Coronary artery bypass graft  2008    x2, negative cath about 8 months ago  . Insert / replace / remove pacemaker    . Refractive surgery    . Hemorrhoid surgery      SOCIAL HISTORY:   Social History  Substance Use Topics  . Smoking status: Former Smoker    Types: Cigarettes    Quit date: 11/21/1987  . Smokeless tobacco: Never Used  . Alcohol Use: No    FAMILY HISTORY:   Family History  Problem Relation Age of Onset  . Coronary artery disease  Sister   . Cancer Mother     gastric  . Lung cancer Brother     DRUG ALLERGIES:   Allergies  Allergen Reactions  . Cortisone Hives  . Penicillins Hives    MEDICATIONS AT HOME:   Prior to Admission medications   Medication Sig Start Date End Date Taking? Authorizing Provider  allopurinol (ZYLOPRIM) 100 MG tablet Take 100 mg by mouth daily.   Yes Historical Provider, MD  aspirin 81 MG EC tablet Take 81 mg by mouth daily.     Yes Historical Provider, MD  docusate sodium (COLACE) 50 MG capsule Take by mouth every evening.    Yes Historical Provider, MD  donepezil (ARICEPT) 5 MG tablet Take 5 mg by mouth at bedtime.   Yes Historical Provider, MD  ferrous sulfate 325 (65 FE) MG tablet Take 325 mg by mouth every evening.    Yes Historical Provider, MD  meclizine (ANTIVERT) 12.5 MG tablet Take 12.5 mg by mouth 2 (two) times daily.   Yes Historical Provider, MD  metoprolol tartrate (LOPRESSOR) 25 MG tablet Take 12.5 mg by mouth 2 (two) times daily.    Yes Historical Provider, MD  nitroGLYCERIN (NITROSTAT) 0.4 MG SL tablet Place 0.4 mg under the tongue every 5 (five) minutes as needed for chest pain.  Yes Historical Provider, MD  omeprazole (PRILOSEC) 20 MG capsule Take 20 mg by mouth daily.   Yes Historical Provider, MD  potassium chloride SA (K-DUR,KLOR-CON) 20 MEQ tablet Take 10 mEq by mouth 2 (two) times daily.   Yes Historical Provider, MD  ramipril (ALTACE) 5 MG capsule Take 5 mg by mouth every morning.    Yes Historical Provider, MD  Tamsulosin HCl (FLOMAX) 0.4 MG CAPS Take 0.4 mg by mouth daily after supper.    Yes Historical Provider, MD  vitamin B-12 (CYANOCOBALAMIN) 1000 MCG tablet Take 1,000 mcg by mouth daily.   Yes Historical Provider, MD    REVIEW OF SYSTEMS:  Review of Systems  Constitutional: Negative for fever, chills, weight loss and malaise/fatigue.  HENT: Negative for ear pain, hearing loss and tinnitus.   Eyes: Negative for blurred vision, double vision, pain and  redness.  Respiratory: Negative for cough, hemoptysis and shortness of breath.   Cardiovascular: Negative for chest pain, palpitations, orthopnea and leg swelling.  Gastrointestinal: Negative for nausea, vomiting, abdominal pain, diarrhea and constipation.  Genitourinary: Negative for dysuria, frequency and hematuria.  Musculoskeletal: Negative for back pain, joint pain and neck pain.  Skin:       No acne, rash, or lesions  Neurological: Positive for dizziness. Negative for tremors, focal weakness and weakness.       Near syncope  Endo/Heme/Allergies: Negative for polydipsia. Does not bruise/bleed easily.  Psychiatric/Behavioral: Negative for depression. The patient is not nervous/anxious and does not have insomnia.      VITAL SIGNS:   Filed Vitals:   10/05/15 2115 10/05/15 2148 10/05/15 2217 10/05/15 2300  BP: 126/70 136/70 142/74 136/80  Pulse: 37 69 68 70  Temp:      TempSrc:      Resp: Weight:      SpO2: 92% 96% 96% 96%   Wt Readings from Last 3 Encounters:  10/05/15 81.194 kg (179 lb)  09/09/15 80.74 kg (178 lb)    PHYSICAL EXAMINATION:  Physical Exam  Vitals reviewed. Constitutional: He is oriented to person, place, and time. He appears well-developed and well-nourished. No distress.  HENT:  Head: Normocephalic and atraumatic.  Mouth/Throat: Oropharynx is clear and moist.  Eyes: Conjunctivae and EOM are normal. Pupils are equal, round, and reactive to light. No scleral icterus.  Neck: Normal range of motion. Neck supple. No JVD present. No thyromegaly present.  Cardiovascular: Intact distal pulses.  Exam reveals no gallop and no friction rub.   No murmur heard. Tachycardic, irregular paced rhythm  Respiratory: Effort normal and breath sounds normal. No respiratory distress. He has no wheezes. He has no rales.  GI: Soft. Bowel sounds are normal. He exhibits no distension. There is no tenderness.  Musculoskeletal: Normal range of motion. He exhibits no  edema.  No arthritis, no gout  Lymphadenopathy:    He has no cervical adenopathy.  Neurological: He is alert and oriented to person, place, and time. No cranial nerve deficit.  No dysarthria, no aphasia  Skin: Skin is warm and dry. No rash noted. No erythema.  Psychiatric: He has a normal mood and affect. His behavior is normal. Judgment and thought content normal.    LABORATORY PANEL:   CBC  Recent Labs Lab 10/05/15 1936  WBC 4.7  HGB 13.6  HCT 41.5  PLT 131*   ------------------------------------------------------------------------------------------------------------------  Chemistries   Recent Labs Lab 10/05/15 1936  NA 138  K 4.5  CL 108  CO2 24  GLUCOSE  96  BUN 24*  CREATININE 1.24  CALCIUM 9.1  AST 24  ALT 16*  ALKPHOS 89  BILITOT 0.6   ------------------------------------------------------------------------------------------------------------------  Cardiac Enzymes  Recent Labs Lab 10/05/15 1936  TROPONINI <0.03   ------------------------------------------------------------------------------------------------------------------  RADIOLOGY:  Dg Chest Portable 1 View  10/05/2015  CLINICAL DATA:  Dizziness today.  Syncope. EXAM: PORTABLE CHEST 1 VIEW COMPARISON:  09/09/2015 FINDINGS: Sternotomy wires and left-sided pacemaker unchanged. Lungs are adequately inflated with no evidence of consolidation or effusion. Moderate stable cardiomegaly. Remainder of the exam is unchanged. IMPRESSION: No acute cardiopulmonary disease. Stable cardiomegaly. Electronically Signed   By: Elberta Fortisaniel  Boyle M.D.   On: 10/05/2015 19:54    EKG:   Orders placed or performed during the hospital encounter of 09/09/15  . EKG 12-Lead  . EKG 12-Lead  . ED EKG within 10 minutes  . ED EKG within 10 minutes  . EKG    IMPRESSION AND PLAN:  Principal Problem:   Near syncope - likely due to his arrhythmia and pacemaker aberrancy. We'll get cardiology consult and interrogate his  pacemaker. We'll defer to cardiology's recommendations for echocardiogram or further workup beyond this. Active Problems:   Arrhythmia - monitor EKG reading atrial fibrillation, but pacemaker spikes present. We will admit tonight on telemetry, and get a cardiology consult as above. Hemodynamically stable.   Chronic systolic CHF (congestive heart failure) (HCC) - continue home meds, not currently in exacerbation.   CAD (coronary artery disease) - stable, continue home meds   HTN (hypertension) - currently controlled, continue home meds   HLD (hyperlipidemia) - continue home anti-lipids   BPH (benign prostatic hyperplasia) - continue home dose Flomax  All the records are reviewed and case discussed with ED provider. Management plans discussed with the patient and/or family.  DVT PROPHYLAXIS: SubQ lovenox  ADMISSION STATUS: Observation  CODE STATUS: Full  TOTAL TIME TAKING CARE OF THIS PATIENT: 40 minutes.    Khia Dieterich FIELDING 10/05/2015, 11:57 PM  [facility]  Office  [phone]  CC: Primary care physician; Leanna SatoMILES,LINDA M, MD

## 2015-10-05 NOTE — ED Notes (Signed)
Pt in with co dizziness today, no syncope no co pain.

## 2015-10-06 ENCOUNTER — Observation Stay: Payer: Medicare PPO

## 2015-10-06 LAB — CBC
HCT: 39.1 % — ABNORMAL LOW (ref 40.0–52.0)
HEMOGLOBIN: 12.9 g/dL — AB (ref 13.0–18.0)
MCH: 31.4 pg (ref 26.0–34.0)
MCHC: 33.1 g/dL (ref 32.0–36.0)
MCV: 94.8 fL (ref 80.0–100.0)
Platelets: 110 10*3/uL — ABNORMAL LOW (ref 150–440)
RBC: 4.12 MIL/uL — AB (ref 4.40–5.90)
RDW: 15 % — ABNORMAL HIGH (ref 11.5–14.5)
WBC: 4.4 10*3/uL (ref 3.8–10.6)

## 2015-10-06 LAB — BASIC METABOLIC PANEL
Anion gap: 5 (ref 5–15)
BUN: 23 mg/dL — AB (ref 6–20)
CHLORIDE: 107 mmol/L (ref 101–111)
CO2: 27 mmol/L (ref 22–32)
Calcium: 8.9 mg/dL (ref 8.9–10.3)
Creatinine, Ser: 1.21 mg/dL (ref 0.61–1.24)
GFR calc Af Amer: >60 mL/min (ref >60)
GFR calc non Af Amer: 54 mL/min — ABNORMAL LOW (ref >60)
Glucose, Bld: 96 mg/dL (ref 65–99)
POTASSIUM: 4.3 mmol/L (ref 3.5–5.1)
SODIUM: 139 mmol/L (ref 135–145)

## 2015-10-06 MED ORDER — DONEPEZIL HCL 5 MG PO TABS
5.0000 mg | ORAL_TABLET | Freq: Every day | ORAL | Status: DC
  Administered 2015-10-06 – 2015-10-07 (×2): 5 mg via ORAL
  Filled 2015-10-06 (×3): qty 1

## 2015-10-06 MED ORDER — SODIUM CHLORIDE 0.9 % IV SOLN
INTRAVENOUS | Status: AC
  Administered 2015-10-06: 02:00:00 via INTRAVENOUS

## 2015-10-06 MED ORDER — ENOXAPARIN SODIUM 40 MG/0.4ML ~~LOC~~ SOLN
40.0000 mg | SUBCUTANEOUS | Status: DC
  Administered 2015-10-07: 40 mg via SUBCUTANEOUS
  Filled 2015-10-06 (×2): qty 0.4

## 2015-10-06 MED ORDER — RAMIPRIL 5 MG PO CAPS
5.0000 mg | ORAL_CAPSULE | Freq: Every morning | ORAL | Status: DC
  Administered 2015-10-06: 5 mg via ORAL
  Filled 2015-10-06 (×2): qty 1

## 2015-10-06 MED ORDER — ASPIRIN EC 81 MG PO TBEC
81.0000 mg | DELAYED_RELEASE_TABLET | Freq: Every day | ORAL | Status: DC
  Administered 2015-10-06 – 2015-10-08 (×3): 81 mg via ORAL
  Filled 2015-10-06 (×3): qty 1

## 2015-10-06 MED ORDER — ONDANSETRON HCL 4 MG PO TABS: 4.0000 mg | ORAL_TABLET | Freq: Four times a day (QID) | ORAL | Status: DC | PRN

## 2015-10-06 MED ORDER — METOPROLOL TARTRATE 25 MG PO TABS
12.5000 mg | ORAL_TABLET | Freq: Two times a day (BID) | ORAL | Status: DC
  Administered 2015-10-06 – 2015-10-07 (×3): 12.5 mg via ORAL
  Filled 2015-10-06 (×3): qty 1

## 2015-10-06 MED ORDER — ACETAMINOPHEN 325 MG PO TABS: 650.0000 mg | ORAL_TABLET | Freq: Four times a day (QID) | ORAL | Status: DC | PRN

## 2015-10-06 MED ORDER — ALLOPURINOL 100 MG PO TABS
100.0000 mg | ORAL_TABLET | Freq: Every day | ORAL | Status: DC
  Administered 2015-10-06 – 2015-10-08 (×3): 100 mg via ORAL
  Filled 2015-10-06 (×3): qty 1

## 2015-10-06 MED ORDER — ACETAMINOPHEN 650 MG RE SUPP: 650.0000 mg | Freq: Four times a day (QID) | RECTAL | Status: DC | PRN

## 2015-10-06 MED ORDER — RAMIPRIL 2.5 MG PO CAPS
2.5000 mg | ORAL_CAPSULE | Freq: Every morning | ORAL | Status: DC
  Administered 2015-10-07: 2.5 mg via ORAL
  Filled 2015-10-06: qty 1

## 2015-10-06 MED ORDER — PANTOPRAZOLE SODIUM 40 MG PO TBEC
40.0000 mg | DELAYED_RELEASE_TABLET | Freq: Every day | ORAL | Status: DC
  Administered 2015-10-06 – 2015-10-08 (×3): 40 mg via ORAL
  Filled 2015-10-06 (×3): qty 1

## 2015-10-06 MED ORDER — TAMSULOSIN HCL 0.4 MG PO CAPS
0.4000 mg | ORAL_CAPSULE | Freq: Every day | ORAL | Status: DC
  Administered 2015-10-06 – 2015-10-07 (×2): 0.4 mg via ORAL
  Filled 2015-10-06 (×2): qty 1

## 2015-10-06 MED ORDER — ONDANSETRON HCL 4 MG/2ML IJ SOLN: 4.0000 mg | Freq: Four times a day (QID) | INTRAMUSCULAR | Status: DC | PRN

## 2015-10-06 MED ORDER — SODIUM CHLORIDE 0.9 % IJ SOLN: 3.0000 mL | INTRAMUSCULAR | Status: DC | PRN

## 2015-10-06 NOTE — Care Management (Signed)
Observation admission for syncope.  Patient has permanent pacemaker and had recent generator change out.  there does not appear to be any malfunction of the pacemaker.  Anticipate discharge within observation time frame.  No needs identified

## 2015-10-06 NOTE — Care Management Obs Status (Signed)
MEDICARE OBSERVATION STATUS NOTIFICATION   Patient Details  Name: Gregory LoaWilliam A Inman MRN: [id number] Date of Birth: 03/06/1931   Medicare Observation Status Notification Given:  Yes Patient admitted under observation.  Presented and explained observation notice.  Daughter signed.  Original given to patient and copy placed in medical record.  Copy manually delivered to HIM     Eber HongGreene, Sakshi Sermons R, RN 10/06/2015, 9:51 AM

## 2015-10-06 NOTE — ED Notes (Signed)
Report called to Avery DennisonBrooke RN, pt admitted 2A via stretcher.

## 2015-10-06 NOTE — Progress Notes (Signed)
Patient has no complaints of pain. VSS.  Paced on monitor.

## 2015-10-06 NOTE — Progress Notes (Signed)
[facility] - [place] at [facility]   PATIENT NAME: Gregory [name]    MR#:  [id number]  DATE OF BIRTH:  03/06/1931  SUBJECTIVE:  CHIEF COMPLAINT:   Chief Complaint  Patient presents with  . Dizziness   patient is a 81 year old Caucasian male with past medical history stated in the history of chronic combined systolic and diastolic CHF, cardiomyopathy with ejection fraction of 45%, anemia, obstructive sleep apnea, coronary artery disease, status post permanent pacemaker placement and redo of wires presents to the hospital with complaints of presyncopal episodes. In emergency room, he was noted to be tachycardic, but capturing QRS complexes. No obvious bradycardia was noted, although documented to 30s, according to nursing staff due to automated measuring machine. Remains tachycardic from 100s to 120s on telemetry. Denies any chest pains.   Review of Systems  Constitutional: Negative for fever, chills and weight loss.  HENT: Negative for congestion.   Eyes: Negative for blurred vision and double vision.  Respiratory: Negative for cough, sputum production, shortness of breath and wheezing.   Cardiovascular: Negative for chest pain, palpitations, orthopnea, leg swelling and PND.  Gastrointestinal: Negative for nausea, vomiting, abdominal pain, diarrhea, constipation and blood in stool.  Genitourinary: Negative for dysuria, urgency, frequency and hematuria.  Musculoskeletal: Negative for falls.  Neurological: Positive for dizziness. Negative for tremors, focal weakness and headaches.  Endo/Heme/Allergies: Does not bruise/bleed easily.  Psychiatric/Behavioral: Negative for depression. The patient does not have insomnia.     VITAL SIGNS: Blood pressure 119/67, pulse 37, temperature 98.2 F (36.8 C), temperature source Oral, resp. rate 19, height  (1.626 [name]), weight 76.885 kg (169 lb 8 oz), SpO2 100 %.  PHYSICAL EXAMINATION:   GENERAL:  81 y.o.-year-old patient  lying in the bed with no acute distress.  EYES: Pupils equal, round, reactive to light and accommodation. No scleral icterus. Extraocular muscles intact.  HEENT: Head atraumatic, normocephalic. Oropharynx and nasopharynx clear.  NECK:  Supple, no jugular venous distention. No thyroid enlargement, no tenderness.  LUNGS: Normal breath sounds bilaterally, no wheezing, rales,rhonchi or crepitation. No use of accessory muscles of respiration.  CARDIOVASCULAR: S1, S2 , tachycardic. No murmurs, rubs, or gallops.  ABDOMEN: Soft, nontender, nondistended. Bowel sounds present. No organomegaly or mass.  EXTREMITIES: No pedal edema, cyanosis, or clubbing.  NEUROLOGIC: Cranial nerves II through XII are intact. Muscle strength 5/5 in all extremities. Sensation intact. Gait not checked.  PSYCHIATRIC: The patient is alert and oriented x 3.  SKIN: No obvious rash, lesion, or ulcer.   ORDERS/RESULTS REVIEWED:   CBC  Recent Labs Lab 10/05/15 1936 10/06/15 0444  WBC 4.7 4.4  HGB 13.6 12.9*  HCT 41.5 39.1*  PLT 131* 110*  MCV 94.9 94.8  MCH 31.1 31.4  MCHC 32.8 33.1  RDW 15.2* 15.0*  LYMPHSABS 1.2  --   MONOABS 0.6  --   EOSABS 0.1  --   BASOSABS 0.1  --    ------------------------------------------------------------------------------------------------------------------  Chemistries   Recent Labs Lab 10/05/15 1936 10/06/15 0444  NA 138 139  K 4.5 4.3  CL 108 107  CO2 24 27  GLUCOSE 96 96  BUN 24* 23*  CREATININE 1.24 1.21  CALCIUM 9.1 8.9  AST 24  --   ALT 16*  --   ALKPHOS 89  --   BILITOT 0.6  --    ------------------------------------------------------------------------------------------------------------------ estimated creatinine clearance is 44.1 mL/min (by C-G formula based on Cr of 1.21). ------------------------------------------------------------------------------------------------------------------ No results for input(s): TSH, T4TOTAL, T3FREE, THYROIDAB in  the last 72  hours.  Invalid input(s): FREET3  Cardiac Enzymes  Recent Labs Lab 10/05/15 1936  TROPONINI <0.03   ------------------------------------------------------------------------------------------------------------------ Invalid input(s): POCBNP ---------------------------------------------------------------------------------------------------------------  RADIOLOGY: Dg Chest Portable 1 View  10/05/2015  CLINICAL DATA:  Dizziness today.  Syncope. EXAM: PORTABLE CHEST 1 VIEW COMPARISON:  09/09/2015 FINDINGS: Sternotomy wires and left-sided pacemaker unchanged. Lungs are adequately inflated with no evidence of consolidation or effusion. Moderate stable cardiomegaly. Remainder of the exam is unchanged. IMPRESSION: No acute cardiopulmonary disease. Stable cardiomegaly. Electronically Signed   By: Elberta Fortisaniel  Boyle Gregory [name].   On: 10/05/2015 19:54    EKG:  Orders placed or performed during the hospital encounter of 09/09/15  . EKG 12-Lead  . EKG 12-Lead  . ED EKG within 10 minutes  . ED EKG within 10 minutes  . EKG    ASSESSMENT AND PLAN:  Principal Problem:   Near syncope Active Problems:   Arrhythmia   Chronic systolic CHF (congestive heart failure) (HCC)   CAD (coronary artery disease)   HTN (hypertension)   HLD (hyperlipidemia)   BPH (benign prostatic hyperplasia) 1. Near syncope of unclear etiology at this time, patient's blood pressure is normal, even high at times, orthostatics are unremarkable. Get carotid ultrasound, following clinically 2. Tachycardia, advance metoprolol to 25 mg twice daily dose. Discussed with cardiologist 3. Chronic combined systolic and diastolic CHF, continue ACE inhibitor, advance as needed , continue beta blocker, which was advanced, stable clinically 4. Chronic dizziness, patient admits of improvement of dizziness with meclizine, continue meclizine, getting carotid ultrasound to rule out abnormalities. Get physical therapist involved for recommendations 5.  Essential hypertension. As above, advancing beta blocker and decreasing ACE inhibitor dose, adjust as needed  Management plans discussed with the patient, family and they are in agreement.   DRUG ALLERGIES:  Allergies  Allergen Reactions  . Cortisone Hives  . Penicillins Hives    CODE STATUS:     Code Status Orders        Start     Ordered   10/06/15 0010  Full code   Continuous     10/06/15 0009    Advance Directive Documentation        Most Recent Value   Type of Advance Directive  Healthcare Power of Attorney   Pre-existing out of facility DNR order (yellow form or pink MOST form)     "MOST" Form in Place?        TOTAL TIME TAKING CARE OF THIS PATIENT: 40 minutes.    Katharina CaperVAICKUTE,Gregory [name] Gregory [name] on 10/06/2015 at 3:17 PM  Between 7am to 6pm - Pager - [phone]  After 6pm go to [email or web address] - password [email or web address] [email or web address]  [facility]  Office  [phone]  CC: Primary care physician; Leanna SatoMILES,Gregory M, MD

## 2015-10-06 NOTE — Consult Note (Signed)
[facility]  CARDIOLOGY CONSULT NOTE  Patient ID: Gregory LoaWilliam A Mcmurtry MRN: [id number] DOB/AGE: 79/17/1932 79 y.o.  Admit date: 10/05/2015 [facility]   [facility] Reason for Consultation Syncope  HPI: 81 yo male with history of hypertension, cad, cardiomyopathy, ckd and sss with ppm with recent generator change out who presents to the er with complains of sob and dizziness and near syncope with possible syncopal episodes. In the er he was ntoed to have afib with variable ventricular response. Reports in chart of low heart rates but no documentation available. On telemetry he has had afib with variable vr with no pauses or evidence of pacer spikes with no ventricular response. Pt has dizziness when bending over. Also had nausea at time of most recent syncopal episode.   ROS Review of Systems - History obtained from child, chart review and the patient General ROS: positive for  - near syncope and syncope Respiratory ROS: no cough, shortness of breath, or wheezing Cardiovascular ROS: positive for - loss of consciousness and dizziness Gastrointestinal ROS: no abdominal pain, change in bowel habits, or black or bloody stools Musculoskeletal ROS: negative Neurological ROS: positive for - dizziness   Past Medical History  Diagnosis Date  . Chronic systolic CHF (congestive heart failure) (HCC)     EF of 45% with diastolic dysfunction, grade I  . History of anemia     secondary to multiple AVMs  . Obstructive sleep apnea   . Coronary artery disease     s/p CABG in 2008 and a recent catheterization which showed no new occlusion   . Hypertension   . Hyperlipidemia   . BPH (benign prostatic hyperplasia)     Family History  Problem Relation Age of Onset  . Coronary artery disease Sister   . Cancer Mother     gastric  . Lung cancer Brother     Social History   Social History  . Marital Status:  Widowed    Spouse Name: N/A  . Number of Children: N/A  . Years of Education: N/A   Occupational History  . Not on file.   Social History Main Topics  . Smoking status: Former Smoker    Types: Cigarettes    Quit date: 11/20/1982  . Smokeless tobacco: Never Used  . Alcohol Use: No  . Drug Use: No  . Sexual Activity: No   Other Topics Concern  . Not on file   Social History Narrative    Past Surgical History  Procedure Laterality Date  . Coronary artery bypass graft  2008    x2, negative cath about 8 months ago  . Insert / replace / remove pacemaker    . Refractive surgery    . Hemorrhoid surgery       Prescriptions prior to admission  Medication Sig Dispense Refill Last Dose  . allopurinol (ZYLOPRIM) 100 MG tablet Take 100 mg by mouth daily.   10/05/2015 at Unknown time  . aspirin 81 MG EC tablet Take 81 mg by mouth daily.     10/05/2015 at Unknown time  . docusate sodium (COLACE) 50 MG capsule Take by mouth every evening.    10/05/2015 at Unknown time  . donepezil (ARICEPT) 5 MG tablet Take 5 mg by mouth at bedtime.   10/05/2015 at Unknown time  . ferrous sulfate 325 (65 FE) MG tablet Take 325 mg by mouth every evening.    10/05/2015 at Unknown time  . meclizine (  ANTIVERT) 12.5 MG tablet Take 12.5 mg by mouth 2 (two) times daily.   10/05/2015 at Unknown time  . metoprolol tartrate (LOPRESSOR) 25 MG tablet Take 12.5 mg by mouth 2 (two) times daily.    10/05/2015 at Unknown time  . nitroGLYCERIN (NITROSTAT) 0.4 MG SL tablet Place 0.4 mg under the tongue every 5 (five) minutes as needed for chest pain.   PRN at PRN  . omeprazole (PRILOSEC) 20 MG capsule Take 20 mg by mouth daily.   10/05/2015 at Unknown time  . potassium chloride SA (K-DUR,KLOR-CON) 20 MEQ tablet Take 10 mEq by mouth 2 (two) times daily.   10/05/2015 at Unknown time  . ramipril (ALTACE) 5 MG capsule Take 5 mg by mouth every morning.    10/05/2015 at Unknown time  . Tamsulosin HCl (FLOMAX) 0.4 MG CAPS Take 0.4  mg by mouth daily after supper.    10/05/2015 at Unknown time  . vitamin B-12 (CYANOCOBALAMIN) 1000 MCG tablet Take 1,000 mcg by mouth daily.   10/05/2015 at Unknown time    Physical Exam: Blood pressure 119/67, pulse 37, temperature 98.2 F (36.8 C), temperature source Oral, resp. rate 19, height  (1.626 m), weight 76.885 kg (169 lb 8 oz), SpO2 100 %.    General appearance: cooperative Head: Normocephalic, without obvious abnormality, atraumatic Resp: clear to auscultation bilaterally Cardio: irregularly irregular rhythm GI: soft, non-tender; bowel sounds normal; no masses,  no organomegaly Extremities: extremities normal, atraumatic, no cyanosis or edema Neurologic: Grossly normal Labs:   Lab Results  Component Value Date   WBC 4.4 10/06/2015   HGB 12.9* 10/06/2015   HCT 39.1* 10/06/2015   MCV 94.8 10/06/2015   PLT 110* 10/06/2015    Recent Labs Lab 10/05/15 1936 10/06/15 0444  NA 138 139  K 4.5 4.3  CL 108 107  CO2 24 27  BUN 24* 23*  CREATININE 1.24 1.21  CALCIUM 9.1 8.9  PROT 6.9  --   BILITOT 0.6  --   ALKPHOS 89  --   ALT 16*  --   AST 24  --   GLUCOSE 96 96   Lab Results  Component Value Date   CKMB 2.0 08/03/2012   TROPONINI <0.03 10/05/2015      Radiology: cxr revealed no acute cardiopulmonary disease EKG: afib with intermittent v paced rhythm. Occasional rapid vr  ASSESSMENT AND PLAN:  81 yo male with history of sss with ppm with recent generator changeout and no lead modifications. Has had near syncope or presyncope . This appears to be somewhat orthosatatic or vagal. No evidence of pacemaker malfunciton. WIll increase metoprolol for increased vr and reduce ramapril and follw. Ambulate and consider discharge in am if stable.  Signed: Dalia Heading MD, [facility] 10/06/2015, 12:31 PM

## 2015-10-07 LAB — CBC
HEMATOCRIT: 39.6 % — AB (ref 40.0–52.0)
HEMOGLOBIN: 12.9 g/dL — AB (ref 13.0–18.0)
MCH: 30.8 pg (ref 26.0–34.0)
MCHC: 32.7 g/dL (ref 32.0–36.0)
MCV: 94.4 fL (ref 80.0–100.0)
Platelets: 111 10*3/uL — ABNORMAL LOW (ref 150–440)
RBC: 4.19 MIL/uL — AB (ref 4.40–5.90)
RDW: 15 % — ABNORMAL HIGH (ref 11.5–14.5)
WBC: 3.6 10*3/uL — AB (ref 3.8–10.6)

## 2015-10-07 MED ORDER — METOPROLOL TARTRATE 25 MG PO TABS
25.0000 mg | ORAL_TABLET | Freq: Two times a day (BID) | ORAL | Status: DC
  Administered 2015-10-07 – 2015-10-08 (×2): 25 mg via ORAL
  Filled 2015-10-07 (×2): qty 1

## 2015-10-07 MED ORDER — RISPERIDONE 0.5 MG PO TBDP
0.5000 mg | ORAL_TABLET | Freq: Two times a day (BID) | ORAL | Status: DC
  Administered 2015-10-07 – 2015-10-08 (×3): 0.5 mg via ORAL
  Filled 2015-10-07 (×3): qty 1

## 2015-10-07 NOTE — Care Management (Signed)
Spoke with patient and family regarding home health. Patient is finally agreeable.  discussed discharge with attending and the documented heart rates of 34 and 42 today.  Discussed the accuracy of these rates with primary nurse and informed that the rate of 34 and 42 documentation is not accurate and attempt to have documentation corrected.  Agency preference is [facility].  [facility]

## 2015-10-07 NOTE — Progress Notes (Signed)
[facility] - [place] [facility]   PATIENT NAME: Gregory AllegraWilliam Pendergrass    MR#:  [id number]  DATE OF BIRTH:  03/06/1931  SUBJECTIVE:  CHIEF COMPLAINT:   Chief Complaint  Patient presents with  . Dizziness   patient is a 81 year old Caucasian male with past medical history stated in the history of chronic combined systolic and diastolic CHF, cardiomyopathy with ejection fraction of 45%, anemia, obstructive sleep apnea, coronary artery disease, status post permanent pacemaker placement and redo of wires presents to the hospital with complaints of presyncopal episodes. In emergency room, he was noted to be tachycardic, but capturing QRS complexes. No obvious bradycardia was noted, although documented to 30s, according to nursing staff due to automated measuring machine. Remains tachycardic from 100s to 120s on telemetry. Denies any chest pains. Intermittently confused, very impulsive. Demands to return back home today, but the patient's son is very reluctant for him to go home due to concerns of fall  Review of Systems  Constitutional: Negative for fever, chills and weight loss.  HENT: Negative for congestion.   Eyes: Negative for blurred vision and double vision.  Respiratory: Negative for cough, sputum production, shortness of breath and wheezing.   Cardiovascular: Negative for chest pain, palpitations, orthopnea, leg swelling and PND.  Gastrointestinal: Negative for nausea, vomiting, abdominal pain, diarrhea, constipation and blood in stool.  Genitourinary: Negative for dysuria, urgency, frequency and hematuria.  Musculoskeletal: Negative for falls.  Neurological: Positive for dizziness. Negative for tremors, focal weakness and headaches.  Endo/Heme/Allergies: Does not bruise/bleed easily.  Psychiatric/Behavioral: Negative for depression. The patient does not have insomnia.     VITAL SIGNS: Blood pressure 122/66, pulse 42, temperature 98.7 F (37.1 C), temperature  source Oral, resp. rate 18, height 5\' 4"  (1.626 m), weight 76.885 kg (169 lb 8 oz), SpO2 100 %.  PHYSICAL EXAMINATION:   GENERAL:  81 y.o.-year-old patient lying in the bed with no acute distress.  EYES: Pupils equal, round, reactive to light and accommodation. No scleral icterus. Extraocular muscles intact.  HEENT: Head atraumatic, normocephalic. Oropharynx and nasopharynx clear.  NECK:  Supple, no jugular venous distention. No thyroid enlargement, no tenderness.  LUNGS: Normal breath sounds bilaterally, no wheezing, rales,rhonchi or crepitation. No use of accessory muscles of respiration.  CARDIOVASCULAR: S1, S2 , tachycardic. No murmurs, rubs, or gallops.  ABDOMEN: Soft, nontender, nondistended. Bowel sounds present. No organomegaly or mass.  EXTREMITIES: No pedal edema, cyanosis, or clubbing.  NEUROLOGIC: Cranial nerves II through XII are intact. Muscle strength 5/5 in all extremities. Sensation intact. Gait not checked.  PSYCHIATRIC: The patient is alert and oriented x 2, but according to nursing staff more confuses day progressed, impulsive.  SKIN: No obvious rash, lesion, or ulcer.   ORDERS/RESULTS REVIEWED:   CBC  Recent Labs Lab 10/05/15 1936 10/06/15 0444 10/07/15 0353  WBC 4.7 4.4 3.6*  HGB 13.6 12.9* 12.9*  HCT 41.5 39.1* 39.6*  PLT 131* 110* 111*  MCV 94.9 94.8 94.4  MCH 31.1 31.4 30.8  MCHC 32.8 33.1 32.7  RDW 15.2* 15.0* 15.0*  LYMPHSABS 1.2  --   --   MONOABS 0.6  --   --   EOSABS 0.1  --   --   BASOSABS 0.1  --   --    ------------------------------------------------------------------------------------------------------------------  Chemistries   Recent Labs Lab 10/05/15 1936 10/06/15 0444  NA 138 139  K 4.5 4.3  CL 108 107  CO2 24 27  GLUCOSE 96 96  BUN 24* 23*  CREATININE 1.24 1.21  CALCIUM 9.1 8.9  AST 24  --   ALT 16*  --   ALKPHOS 89  --   BILITOT 0.6  --     ------------------------------------------------------------------------------------------------------------------ estimated creatinine clearance is 44.1 mL/min (by C-G formula based on Cr of 1.21). ------------------------------------------------------------------------------------------------------------------ No results for input(s): TSH, T4TOTAL, T3FREE, THYROIDAB in the last 72 hours.  Invalid input(s): FREET3  Cardiac Enzymes  Recent Labs Lab 10/05/15 1936  TROPONINI <0.03   ------------------------------------------------------------------------------------------------------------------ Invalid input(s): POCBNP ---------------------------------------------------------------------------------------------------------------  RADIOLOGY: US Carotid Bilateral  10/06/2015  CLINICAL DATA:  Stroke.  Hypertension, syncope, coronary disease. EXAM: BILATERAL CAROTID DUPLEX ULTRASOUND TECHNIQUE: Wallace Cullens scale imaging, color Doppler and duplex ultrasound was performed of bilateral carotid and vertebral arteries in the neck. COMPARISON:  01/15/2015 REVIEW OF SYSTEMS: Quantification of carotid stenosis is based on velocity parameters that correlate the residual internal carotid diameter with NASCET-based stenosis levels, using the diameter of the distal internal carotid lumen as the denominator for stenosis measurement. The following velocity measurements were obtained: PEAK SYSTOLIC/END DIASTOLIC RIGHT ICA:                     78/21cm/sec CCA:                     66/14cm/sec SYSTOLIC ICA/CCA RATIO:  1.2 DIASTOLIC ICA/CCA RATIO: 1.5 ECA:                     74cm/sec LEFT ICA:                     76/24cm/sec CCA:                     65/13cm/sec SYSTOLIC ICA/CCA RATIO:  1.2 DIASTOLIC ICA/CCA RATIO: 1.9 ECA:                     119cm/sec FINDINGS: RIGHT CAROTID ARTERY: Scattered nonocclusive plaque through the common carotid artery again bulb into the proximal ICA. No high-grade stenosis. Normal waveforms  and color Doppler signal. RIGHT VERTEBRAL ARTERY:  Normal flow direction and waveform. LEFT CAROTID ARTERY: Scattered plaque through the common carotid artery, circumferentially in the bulb and proximal ICA, without high-grade stenosis. Normal waveforms and color Doppler signal. LEFT VERTEBRAL ARTERY: Normal flow direction and waveform. IMPRESSION: 1. Extensive bilateral carotid plaque resulting in less than 50% diameter stenosis bilaterally. The exam does not exclude plaque ulceration or embolization. Continued surveillance recommended. Electronically Signed   By: Corlis Leak M.D.   On: 10/06/2015 18:21   Dg Chest Portable 1 View  10/05/2015  CLINICAL DATA:  Dizziness today.  Syncope. EXAM: PORTABLE CHEST 1 VIEW COMPARISON:  09/09/2015 FINDINGS: Sternotomy wires and left-sided pacemaker unchanged. Lungs are adequately inflated with no evidence of consolidation or effusion. Moderate stable cardiomegaly. Remainder of the exam is unchanged. IMPRESSION: No acute cardiopulmonary disease. Stable cardiomegaly. Electronically Signed   By: Elberta Fortis M.D.   On: 10/05/2015 19:54    EKG:  Orders placed or performed during the hospital encounter of 09/09/15  . EKG 12-Lead  . EKG 12-Lead  . ED EKG within 10 minutes  . ED EKG within 10 minutes  . EKG    ASSESSMENT AND PLAN:  Principal Problem:   Near syncope Active Problems:   Arrhythmia   Chronic systolic CHF (congestive heart failure) (HCC)   CAD (coronary artery disease)   HTN (hypertension)   HLD (hyperlipidemia)   BPH (benign prostatic  hyperplasia) 1. Near syncope of unclear etiology at this time, patient's blood pressure is low, could be related to hypotension,  orthostatics were unremarkable. carotid ultrasound revealed extensive bilateral carotid plaque disease resulting in less than 50% stenosis bilaterally.  2. Tachycardia, advance metoprolol to 25 mg twice daily dose provided patient's blood pressure tolerates. Discussed with cardiologist   DR Lady [name], ambulate patient and follow patient's heart rate and blood pressure readings on exertion 3. Chronic combined systolic and diastolic CHF, discontinue ACE inhibitor due to hypotension and advancement of beta blockers, resume ACE inhibitor depending on patient's blood pressure readings 4. Chronic dizziness, patient admits of improvement of dizziness with meclizine, continue meclizine, carotid ultrasound showed no vertebral artery abnormalities bilaterally. Agreed for physical therapy at home, discussed with patient's son 5. Essential hypertension, hypotensive, now, which was advancement of beta blocker and continue ramipril    Management plans discussed with the patient's son  DRUG ALLERGIES:  Allergies  Allergen Reactions  . Cortisone Hives  . Penicillins Hives    CODE STATUS:     Code Status Orders        Start     Ordered   10/06/15 0010  Full code   Continuous     10/06/15 0009    Advance Directive Documentation        Most Recent Value   Type of Advance Directive  Healthcare Power of Attorney   Pre-existing out of facility DNR order (yellow form or pink MOST form)     "MOST" Form in Place?        TOTAL TIME TAKING CARE OF THIS PATIENT: 40 minutes.  Coordination of care time 15 minutes  Yarisa Lynam M.D on 10/07/2015 at 4:13 PM  Between 7am to 6pm - Pager - [phone]  After 6pm go to [email or web address] - password [email or web address] [facility]  [place] [facility]  Office  [phone]  CC: Primary care physician; Leanna Sato, MD

## 2015-10-07 NOTE — Evaluation (Signed)
Physical Therapy Evaluation Patient Details Name: Gregory [name] MRN: [id number] DOB: 03/06/1931 Today's Date: 10/07/2015   History of Present Illness  presented to ER secondary to dizziness and near syncope; admitted for observation for syncope work-up.  Initial concern in ER about potential aberrancy with PPM (patient with recent generator change out), but cardiology reporting PPM functioning properly.  Clinical Impression  Upon evaluation, patient alert and oriented to self only; mildly impulsive with all movement transitions.  Bilat UE/LE strength and ROM grossly WFL and symmetrical; denies pain or symptoms of dizziness at this time. Able to complete sit/stand, basic transfers, gait (400') and stairs (up/down 4 with single rail, cga) without assist device, cga/close sup.  Slight sway at times, but able to self-correct with LE step strategy.  Vitals stable and WFL throughout session with HR steady in 110s at rest and with activity. Per patient/son, patient's functional performance is baseline for him.  No skilled PT needs indicated at this time. Will maintain on caseload during remaining hospitalization to promote continued mobility, but will not require skilled PT upon discharge.    Follow Up Recommendations No PT follow up    Equipment Recommendations       Recommendations for Other Services       Precautions / Restrictions Precautions Precautions: Fall Restrictions Weight Bearing Restrictions: No      Mobility  Bed Mobility               General bed mobility comments: up in chair beginning/end of session  Transfers Overall transfer level: Needs assistance Equipment used: None Transfers: Sit to/from Stand Sit to Stand: Min guard            Ambulation/Gait Ambulation/Gait assistance: Min guard Ambulation Distance (Feet): 400 Feet Assistive device: None   Gait velocity: 10' walk time, 8 seconds Gait velocity interpretation: <1.8 ft/sec, indicative of risk  for recurrent falls General Gait Details: reciprocal stepping pattern, slightly staggernig at times but able to self-correct with LE step strategy.  Slow, but fairly steady, cadence and overall gait speed.  Stairs Stairs: Yes Stairs assistance: Min guard Stair Management: One rail Right Number of Stairs: 6 General stair comments: step through ascending, step to descending; no buckling or LOB  Wheelchair Mobility    Modified Rankin (Stroke Patients Only)       Balance Overall balance assessment: Needs assistance Sitting-balance support: No upper extremity supported;Feet supported Sitting balance-Leahy Scale: Good       Standing balance-Leahy Scale: Fair                               Pertinent Vitals/Pain Pain Assessment: No/denies pain    Home Living Family/patient expects to be discharged to:: Private residence Living Arrangements:  (brother) Available Help at Discharge: Family Type of Home: Mobile home Home Access: Stairs to enter Entrance Stairs-Rails: Right Entrance Stairs-Number of Steps: 2 Home Layout: One level        Prior Function Level of Independence: Independent         Comments: Indep with household mobility, ADLs; denies fall history outside of this admission.     Hand Dominance        Extremity/Trunk Assessment   Upper Extremity Assessment: Overall WFL for tasks assessed           Lower Extremity Assessment: Overall WFL for tasks assessed         Communication   Communication: HOH  Cognition Arousal/Alertness:  Awake/alert Behavior During Therapy: WFL for tasks assessed/performed Overall Cognitive Status:  (oriented to self only; follows one-step commands; impulsive)       Memory: Decreased short-term memory              General Comments      Exercises        Assessment/Plan    PT Assessment Patent does not need any further PT services  PT Diagnosis     PT Problem List    PT Treatment  Interventions     PT Goals (Current goals can be found in the Care Plan section) Acute Rehab PT Goals Patient Stated Goal: "to go home today" PT Goal Formulation: With patient Time For Goal Achievement: 10/21/15 Potential to Achieve Goals: Good    Frequency     Barriers to discharge        Co-evaluation               End of Session Equipment Utilized During Treatment: Gait belt Activity Tolerance: Patient tolerated treatment well Patient left: in chair;with call bell/phone within reach;with chair alarm set Nurse Communication: Mobility status    Functional Assessment Tool Used: clincal judgement, 10' walk time Functional Limitation: Mobility: Walking and moving around Mobility: Walking and Moving Around Current Status [phone]): At least 1 percent but less than 20 percent impaired, limited or restricted Mobility: Walking and Moving Around Goal Status [phone]): At least 1 percent but less than 20 percent impaired, limited or restricted Mobility: Walking and Moving Around Discharge Status [phone]): At least 1 percent but less than 20 percent impaired, limited or restricted    Time: 9562-1308 PT Time Calculation (min) (ACUTE ONLY): 20 min   Charges:   PT Evaluation $Initial PT Evaluation Tier I: 1 Procedure PT Treatments $Gait Training: 8-22 mins   PT G Codes:   PT G-Codes **NOT FOR INPATIENT CLASS** Functional Assessment Tool Used: clincal judgement, 10' walk time Functional Limitation: Mobility: Walking and moving around Mobility: Walking and Moving Around Current Status ([id number]): At least 1 percent but less than 20 percent impaired, limited or restricted Mobility: Walking and Moving Around Goal Status [phone]): At least 1 percent but less than 20 percent impaired, limited or restricted Mobility: Walking and Moving Around Discharge Status [phone]): At least 1 percent but less than 20 percent impaired, limited or restricted   Shermon Bozzi H. Manson Passey, PT, DPT, NCS 10/07/2015, 1:35  PM [phone]

## 2015-10-07 NOTE — Progress Notes (Signed)
Initial Nutrition Assessment   INTERVENTION:  Meals and snacks: Cater to pt preferences   NUTRITION DIAGNOSIS:    (reassess on follow-up) related to   as evidenced by  .    GOAL:   Patient will meet greater than or equal to 90% of their needs    MONITOR:    (Energy intake)  REASON FOR ASSESSMENT:   Malnutrition Screening Tool    ASSESSMENT:      Pt admitted with near syncope, tachycardia, CHF  Past Medical History  Diagnosis Date  . Chronic systolic CHF (congestive heart failure) (HCC)     EF of 45% with diastolic dysfunction, grade I  . History of anemia     secondary to multiple AVMs  . Obstructive sleep apnea   . Coronary artery disease     s/p CABG in 2008 and a recent catheterization which showed no new occlusion   . Hypertension   . Hyperlipidemia   . BPH (benign prostatic hyperplasia)     Current Nutrition: eating 66% of meals per I and O sheet.  Pt talking on the phone during visit, dtr at bedside reports good appetite  Food/Nutrition-Related History: dtr reports good appetite prior to admission   Scheduled Medications:  . allopurinol  100 mg Oral Daily  . aspirin EC  81 mg Oral Daily  . donepezil  5 mg Oral QHS  . enoxaparin (LOVENOX) injection  40 mg Subcutaneous Q24H  . metoprolol tartrate  25 mg Oral BID  . pantoprazole  40 mg Oral Daily  . risperiDONE  0.5 mg Oral BID  . tamsulosin  0.4 mg Oral QPC supper     Electrolyte/Renal Profile and Glucose Profile:   Recent Labs Lab 10/05/15 1936 10/06/15 0444  NA 138 139  K 4.5 4.3  CL 108 107  CO2 24 27  BUN 24* 23*  CREATININE 1.24 1.21  CALCIUM 9.1 8.9  GLUCOSE 96 96   Protein Profile:  Recent Labs Lab 10/05/15 1936  ALBUMIN 3.5    Gastrointestinal Profile: Last BM:11/16   Nutrition-Focused Physical Exam Findings:  Unable to complete Nutrition-Focused physical exam at this time.     Weight Change: per wt encounters 5% wt loss in the last month.  Dtr reports stable  wt    Diet Order:  Diet Heart Room service appropriate?: Yes; Fluid consistency:: Thin  Skin:   reviewed   Height:   Ht Readings from Last 1 Encounters:  10/06/15 5\' 4"  (1.626 m)    Weight:   Wt Readings from Last 1 Encounters:  10/06/15 169 lb 8 oz (76.885 kg)    Ideal Body Weight:     BMI:  Body mass index is 29.08 kg/(m^2).  Estimated Nutritional Needs:   Kcal:  BEE 1381 kcals (IF 1.0-1.2, AF 1.3) [phone] kcals/d.   Protein:  (1.1-1.3 g/kg) 85-100 gm/d  Fluid:  (25-2030ml/kg) [phone]/d  EDUCATION NEEDS:   No education needs identified at this time  MODERATE Care Level  Zuzu Befort B. Freida BusmanAllen, RD, LDN [phone] (pager)

## 2015-10-07 NOTE — Progress Notes (Signed)
Patient was stable overnight, but remained impulsive for most of the night.

## 2015-10-08 DIAGNOSIS — I5042 Chronic combined systolic (congestive) and diastolic (congestive) heart failure: Secondary | ICD-10-CM

## 2015-10-08 DIAGNOSIS — R000 Tachycardia, unspecified: Secondary | ICD-10-CM

## 2015-10-08 DIAGNOSIS — D72819 Decreased white blood cell count, unspecified: Secondary | ICD-10-CM

## 2015-10-08 DIAGNOSIS — I4891 Unspecified atrial fibrillation: Secondary | ICD-10-CM

## 2015-10-08 DIAGNOSIS — I959 Hypotension, unspecified: Secondary | ICD-10-CM

## 2015-10-08 LAB — CBC
HCT: 38.1 % — ABNORMAL LOW (ref 40.0–52.0)
Hemoglobin: 12.4 g/dL — ABNORMAL LOW (ref 13.0–18.0)
MCH: 30.4 pg (ref 26.0–34.0)
MCHC: 32.6 g/dL (ref 32.0–36.0)
MCV: 93.3 fL (ref 80.0–100.0)
PLATELETS: 109 10*3/uL — AB (ref 150–440)
RBC: 4.08 MIL/uL — ABNORMAL LOW (ref 4.40–5.90)
RDW: 15 % — AB (ref 11.5–14.5)
WBC: 3.9 10*3/uL (ref 3.8–10.6)

## 2015-10-08 MED ORDER — METOPROLOL TARTRATE 25 MG PO TABS: 25.0000 mg | ORAL_TABLET | Freq: Two times a day (BID) | ORAL | Status: DC

## 2015-10-08 NOTE — Progress Notes (Signed)
Pt to be discharged today. Ambulated in hall. Gait steady. Home health ordered. Iv and tele remvoed . disch instructions given to pt and his sone. disch via w.c. To home accompanied by his son

## 2015-10-09 NOTE — Discharge Summary (Signed)
[facility] [place] at [facility]   PATIENT NAME: Tramon Crescenzo    MR#:  [id number]  DATE OF BIRTH:  03/06/1931  DATE OF ADMISSION:  10/05/2015 ADMITTING PHYSICIAN: Oralia Manis, MD  DATE OF DISCHARGE: 10/08/2015 12:09 PM  PRIMARY CARE PHYSICIAN: Leanna Sato, MD     ADMISSION DIAGNOSIS:  Bradycardia [R00.1] Atrial fibrillation with rapid ventricular response (HCC) [I48.91] Near syncope [R55]  DISCHARGE DIAGNOSIS:  Principal Problem:   Near syncope Active Problems:   Arrhythmia   Atrial fibrillation with RVR (HCC)   Tachycardia   Hypotension   Chronic systolic CHF (congestive heart failure) (HCC)   CAD (coronary artery disease)   HTN (hypertension)   HLD (hyperlipidemia)   BPH (benign prostatic hyperplasia)   Chronic combined systolic and diastolic CHF (congestive heart failure) (HCC)   Pancytopenia (HCC)   SECONDARY DIAGNOSIS:   Past Medical History  Diagnosis Date  . Chronic systolic CHF (congestive heart failure) (HCC)     EF of 45% with diastolic dysfunction, grade I  . History of anemia     secondary to multiple AVMs  . Obstructive sleep apnea   . Coronary artery disease     s/p CABG in 2008 and a recent catheterization which showed no new occlusion   . Hypertension   . Hyperlipidemia   . BPH (benign prostatic hyperplasia)     .pro HOSPITAL COURSE:  patient is a 81 year old Caucasian male with past medical history significant for  the history of chronic combined systolic and diastolic CHF, cardiomyopathy with ejection fraction of 45%, anemia, obstructive sleep apnea, coronary artery disease, status post permanent pacemaker placement and redo of wires presents to the hospital with complaints of presyncopal episodes. In emergency room, he was noted to be tachycardic, but capturing QRS complexes. No obvious bradycardia was noted, although documented to 30s, according to nursing staff due to automated measuring machine. Remains  tachycardic from 100s to 120s on telemetry. Denies any chest pains. Intermittently confused, very impulsive. The patient was seen by cardiologist who recommended to advance BB dose to slow down heart rate and decrease ACEI dose to avoid hypotension. The patient was observed with advanced dose of metoprolol and did relatively well, he was deemed to be stable to be discharged home with home health. Discussion by problem: 1. Near syncope of unclear etiology, patient's blood pressure was intermittently low, could be related to hypotension, orthostatics were unremarkable. carotid ultrasound revealed extensive bilateral carotid plaque disease resulting in less than 50% stenosis bilaterally. Echo was not performed on this admission, may be beneficial as outpatient, the patient is to follow up with cardiologist within 1 week. 2. Tachycardia, now on advanced metoprolol to 25 mg twice daily dose , watch patient's blood pressure closely. Discussed with cardiologist DR Lady [name], ambulate patient and follow patient's heart rate and blood pressure readings on exertion. Check TSH as outpatient.  3. Chronic combined systolic and diastolic CHF, unfortunately because of hypotension we had to discontinue ACE inhibitor , resume ACE inhibitor depending on patient's blood pressure readings as outpatient. 4. Chronic dizziness, patient admits of improvement of dizziness with meclizine, continue meclizine, carotid ultrasound showed no critical vertebral artery abnormalities bilaterally. Agreed for physical therapy at home, discussed with patient's son 5. Essential hypertension, hypotensive with advanced metoprolol dose, follow as  outpatient.  DISCHARGE CONDITIONS:   fair  CONSULTS OBTAINED:  Treatment Team:  Dalia Heading, MD  DRUG ALLERGIES:   Allergies  Allergen Reactions  . Cortisone Hives  .  Penicillins Hives    DISCHARGE MEDICATIONS:   Discharge Medication List as of 10/08/2015 11:51 AM    CONTINUE these  medications which have CHANGED   Details  metoprolol tartrate (LOPRESSOR) 25 MG tablet Take 1 tablet (25 mg total) by mouth 2 (two) times daily., Starting 10/08/2015, Until Discontinued, Normal      CONTINUE these medications which have NOT CHANGED   Details  allopurinol (ZYLOPRIM) 100 MG tablet Take 100 mg by mouth daily., Until Discontinued, Historical Med    aspirin 81 MG EC tablet Take 81 mg by mouth daily.  , Until Discontinued, Historical Med    docusate sodium (COLACE) 50 MG capsule Take by mouth every evening. , Until Discontinued, Historical Med    donepezil (ARICEPT) 5 MG tablet Take 5 mg by mouth at bedtime., Until Discontinued, Historical Med    ferrous sulfate 325 (65 FE) MG tablet Take 325 mg by mouth every evening. , Until Discontinued, Historical Med    meclizine (ANTIVERT) 12.5 MG tablet Take 12.5 mg by mouth 2 (two) times daily., Until Discontinued, Historical Med    nitroGLYCERIN (NITROSTAT) 0.4 MG SL tablet Place 0.4 mg under the tongue every 5 (five) minutes as needed for chest pain., Until Discontinued, Historical Med    omeprazole (PRILOSEC) 20 MG capsule Take 20 mg by mouth daily., Until Discontinued, Historical Med    Tamsulosin HCl (FLOMAX) 0.4 MG CAPS Take 0.4 mg by mouth daily after supper. , Until Discontinued, Historical Med    vitamin B-12 (CYANOCOBALAMIN) 1000 MCG tablet Take 1,000 mcg by mouth daily., Until Discontinued, Historical Med      STOP taking these medications     potassium chloride SA (K-DUR,KLOR-CON) 20 MEQ tablet      ramipril (ALTACE) 5 MG capsule          DISCHARGE INSTRUCTIONS:    Patient is to follow up with PCP and cardiology as outpatient  If you experience worsening of your admission symptoms, develop shortness of breath, life threatening emergency, suicidal or homicidal thoughts you must seek medical attention immediately by calling 911 or calling your MD immediately  if symptoms less severe.  You Must read complete  instructions/literature along with all the possible adverse reactions/side effects for all the Medicines you take and that have been prescribed to you. Take any new Medicines after you have completely understood and accept all the possible adverse reactions/side effects.   Please note  You were cared for by a hospitalist during your hospital stay. If you have any questions about your discharge medications or the care you received while you were in the hospital after you are discharged, you can call the unit and asked to speak with the hospitalist on call if the hospitalist that took care of you is not available. Once you are discharged, your primary care physician will handle any further medical issues. Please note that NO REFILLS for any discharge medications will be authorized once you are discharged, as it is imperative that you return to your primary care physician (or establish a relationship with a primary care physician if you do not have one) for your aftercare needs so that they can reassess your need for medications and monitor your lab values.    Today   CHIEF COMPLAINT:   Chief Complaint  Patient presents with  . Dizziness    HISTORY OF PRESENT ILLNESS:  Rondall AllegraWilliam Hemp  is a 81 y.o. male with a known history of chronic combined systolic and diastolic CHF, cardiomyopathy with  ejection fraction of 45%, anemia, obstructive sleep apnea, coronary artery disease, status post permanent pacemaker placement and redo of wires presents to the hospital with complaints of presyncopal episodes. In emergency room, he was noted to be tachycardic, but capturing QRS complexes. No obvious bradycardia was noted, although documented to 30s, according to nursing staff due to automated measuring machine. Remains tachycardic from 100s to 120s on telemetry. Denies any chest pains. Intermittently confused, very impulsive. The patient was seen by cardiologist who recommended to advance BB dose to slow down heart  rate and decrease ACEI dose to avoid hypotension. The patient was observed with advanced dose of metoprolol and did relatively well, he was deemed to be stable to be discharged home with home health. Discussion by problem: 1. Near syncope of unclear etiology, patient's blood pressure was intermittently low, could be related to hypotension, orthostatics were unremarkable. carotid ultrasound revealed extensive bilateral carotid plaque disease resulting in less than 50% stenosis bilaterally. Echo was not performed on this admission, may be beneficial as outpatient, the patient is to follow up with cardiologist within 1 week. 2. Tachycardia, now on advanced metoprolol to 25 mg twice daily dose , watch patient's blood pressure closely. Discussed with cardiologist DR Lady [name], ambulate patient and follow patient's heart rate and blood pressure readings on exertion. Check TSH as outpatient.  3. Chronic combined systolic and diastolic CHF, unfortunately because of hypotension we had to discontinue ACE inhibitor , resume ACE inhibitor depending on patient's blood pressure readings as outpatient. 4. Chronic dizziness, patient admits of improvement of dizziness with meclizine, continue meclizine, carotid ultrasound showed no critical vertebral artery abnormalities bilaterally. Agreed for physical therapy at home, discussed with patient's son 5. Essential hypertension, hypotensive with advanced metoprolol dose, follow as  outpatient.     VITAL SIGNS:  Blood pressure 112/67, pulse 70, temperature 98.7 F (37.1 C), temperature source Oral, resp. rate 18, height  (1.626 m), weight 76.885 kg (169 lb 8 oz), SpO2 99 %.  I/O:  No intake or output data in the 24 hours ending 10/09/15 1952  PHYSICAL EXAMINATION:  GENERAL:  81 y.o.-year-old patient lying in the bed with no acute distress.  EYES: Pupils equal, round, reactive to light and accommodation. No scleral icterus. Extraocular muscles intact.  HEENT: Head  atraumatic, normocephalic. Oropharynx and nasopharynx clear.  NECK:  Supple, no jugular venous distention. No thyroid enlargement, no tenderness.  LUNGS: Normal breath sounds bilaterally, no wheezing, rales,rhonchi or crepitation. No use of accessory muscles of respiration.  CARDIOVASCULAR: S1, S2 normal. No murmurs, rubs, or gallops.  ABDOMEN: Soft, non-tender, non-distended. Bowel sounds present. No organomegaly or mass.  EXTREMITIES: No pedal edema, cyanosis, or clubbing.  NEUROLOGIC: Cranial nerves II through XII are intact. Muscle strength 5/5 in all extremities. Sensation intact. Gait not checked.  PSYCHIATRIC: The patient is alert and oriented x 3.  SKIN: No obvious rash, lesion, or ulcer.   DATA REVIEW:   CBC  Recent Labs Lab 10/08/15 0436  WBC 3.9  HGB 12.4*  HCT 38.1*  PLT 109*    Chemistries   Recent Labs Lab 10/05/15 1936 10/06/15 0444  NA 138 139  K 4.5 4.3  CL 108 107  CO2 24 27  GLUCOSE 96 96  BUN 24* 23*  CREATININE 1.24 1.21  CALCIUM 9.1 8.9  AST 24  --   ALT 16*  --   ALKPHOS 89  --   BILITOT 0.6  --     Cardiac Enzymes  Recent Labs Lab  10/05/15 1936  TROPONINI <0.03    Microbiology Results  Results for orders placed or performed during the hospital encounter of 03/02/11  Surgical pcr screen     Status: None   Collection Time: 03/07/11  7:51 PM  Result Value Ref Range Status   MRSA, PCR NEGATIVE NEGATIVE Final   Staphylococcus aureus  NEGATIVE Final    NEGATIVE        The Xpert SA Assay (FDA approved for NASAL specimens only), is one component of a comprehensive surveillance program.  It is not intended to diagnose infection nor to guide or monitor treatment.    RADIOLOGY:  No results found.  EKG:   Orders placed or performed during the hospital encounter of 09/09/15  . EKG 12-Lead  . EKG 12-Lead  . ED EKG within 10 minutes  . ED EKG within 10 minutes  . EKG      Management plans discussed with the patient, family  and they are in agreement.  CODE STATUS:  Advance Directive Documentation        Most Recent Value   Type of Advance Directive  Healthcare Power of Attorney   Pre-existing out of facility DNR order (yellow form or pink MOST form)     "MOST" Form in Place?        TOTAL TIME TAKING CARE OF THIS PATIENT: 40 minutes.    Katharina Caper M.D on 10/09/2015 at 7:52 PM  Between 7am to 6pm - Pager - [phone]  After 6pm go to [email or web address] - password [email or web address] [facility]  [facility]  Office  [phone]  CC: Primary care physician; Leanna Sato, MD

## 2015-10-12 ENCOUNTER — Emergency Department: Payer: Medicare PPO

## 2015-10-12 ENCOUNTER — Encounter: Payer: Self-pay | Admitting: Emergency Medicine

## 2015-10-12 ENCOUNTER — Observation Stay
Admission: EM | Admit: 2015-10-12 | Discharge: 2015-10-14 | Disposition: A | Discharge status: Home Health | Payer: Medicare PPO | Attending: Internal Medicine | Admitting: Internal Medicine

## 2015-10-12 DIAGNOSIS — Z8719 Personal history of other diseases of the digestive system: Secondary | ICD-10-CM | POA: Insufficient documentation

## 2015-10-12 DIAGNOSIS — Z7982 Long term (current) use of aspirin: Secondary | ICD-10-CM | POA: Insufficient documentation

## 2015-10-12 DIAGNOSIS — Z951 Presence of aortocoronary bypass graft: Secondary | ICD-10-CM | POA: Insufficient documentation

## 2015-10-12 DIAGNOSIS — R55 Syncope and collapse: Secondary | ICD-10-CM | POA: Diagnosis present

## 2015-10-12 DIAGNOSIS — I959 Hypotension, unspecified: Secondary | ICD-10-CM | POA: Insufficient documentation

## 2015-10-12 DIAGNOSIS — G4730 Sleep apnea, unspecified: Secondary | ICD-10-CM | POA: Insufficient documentation

## 2015-10-12 DIAGNOSIS — R5383 Other fatigue: Secondary | ICD-10-CM | POA: Diagnosis not present

## 2015-10-12 DIAGNOSIS — Z880 Allergy status to penicillin: Secondary | ICD-10-CM | POA: Insufficient documentation

## 2015-10-12 DIAGNOSIS — E785 Hyperlipidemia, unspecified: Secondary | ICD-10-CM | POA: Insufficient documentation

## 2015-10-12 DIAGNOSIS — R531 Weakness: Secondary | ICD-10-CM | POA: Insufficient documentation

## 2015-10-12 DIAGNOSIS — I495 Sick sinus syndrome: Secondary | ICD-10-CM | POA: Insufficient documentation

## 2015-10-12 DIAGNOSIS — Z800 Family history of malignant neoplasm of digestive organs: Secondary | ICD-10-CM | POA: Diagnosis not present

## 2015-10-12 DIAGNOSIS — Z79899 Other long term (current) drug therapy: Secondary | ICD-10-CM | POA: Diagnosis not present

## 2015-10-12 DIAGNOSIS — G4733 Obstructive sleep apnea (adult) (pediatric): Secondary | ICD-10-CM | POA: Diagnosis not present

## 2015-10-12 DIAGNOSIS — Z8249 Family history of ischemic heart disease and other diseases of the circulatory system: Secondary | ICD-10-CM | POA: Insufficient documentation

## 2015-10-12 DIAGNOSIS — D509 Iron deficiency anemia, unspecified: Secondary | ICD-10-CM | POA: Insufficient documentation

## 2015-10-12 DIAGNOSIS — R42 Dizziness and giddiness: Secondary | ICD-10-CM | POA: Insufficient documentation

## 2015-10-12 DIAGNOSIS — I6523 Occlusion and stenosis of bilateral carotid arteries: Secondary | ICD-10-CM | POA: Diagnosis not present

## 2015-10-12 DIAGNOSIS — D649 Anemia, unspecified: Secondary | ICD-10-CM | POA: Diagnosis not present

## 2015-10-12 DIAGNOSIS — I5042 Chronic combined systolic (congestive) and diastolic (congestive) heart failure: Principal | ICD-10-CM | POA: Insufficient documentation

## 2015-10-12 DIAGNOSIS — R000 Tachycardia, unspecified: Secondary | ICD-10-CM | POA: Insufficient documentation

## 2015-10-12 DIAGNOSIS — I10 Essential (primary) hypertension: Secondary | ICD-10-CM | POA: Diagnosis not present

## 2015-10-12 DIAGNOSIS — D61818 Other pancytopenia: Secondary | ICD-10-CM | POA: Diagnosis not present

## 2015-10-12 DIAGNOSIS — I2510 Atherosclerotic heart disease of native coronary artery without angina pectoris: Secondary | ICD-10-CM | POA: Diagnosis not present

## 2015-10-12 DIAGNOSIS — F0390 Unspecified dementia without behavioral disturbance: Secondary | ICD-10-CM | POA: Diagnosis not present

## 2015-10-12 DIAGNOSIS — Z950 Presence of cardiac pacemaker: Secondary | ICD-10-CM | POA: Diagnosis not present

## 2015-10-12 DIAGNOSIS — Z801 Family history of malignant neoplasm of trachea, bronchus and lung: Secondary | ICD-10-CM | POA: Insufficient documentation

## 2015-10-12 DIAGNOSIS — N400 Enlarged prostate without lower urinary tract symptoms: Secondary | ICD-10-CM | POA: Insufficient documentation

## 2015-10-12 DIAGNOSIS — Z87891 Personal history of nicotine dependence: Secondary | ICD-10-CM | POA: Diagnosis not present

## 2015-10-12 DIAGNOSIS — I429 Cardiomyopathy, unspecified: Secondary | ICD-10-CM | POA: Diagnosis not present

## 2015-10-12 DIAGNOSIS — I4891 Unspecified atrial fibrillation: Secondary | ICD-10-CM | POA: Diagnosis not present

## 2015-10-12 DIAGNOSIS — D696 Thrombocytopenia, unspecified: Secondary | ICD-10-CM | POA: Insufficient documentation

## 2015-10-12 DIAGNOSIS — Z888 Allergy status to other drugs, medicaments and biological substances status: Secondary | ICD-10-CM | POA: Insufficient documentation

## 2015-10-12 LAB — COMPREHENSIVE METABOLIC PANEL
ALBUMIN: 3.5 g/dL (ref 3.5–5.0)
ALT: 23 U/L (ref 17–63)
AST: 37 U/L (ref 15–41)
Alkaline Phosphatase: 93 U/L (ref 38–126)
Anion gap: 7 (ref 5–15)
BUN: 24 mg/dL — AB (ref 6–20)
CHLORIDE: 108 mmol/L (ref 101–111)
CO2: 25 mmol/L (ref 22–32)
CREATININE: 1.15 mg/dL (ref 0.61–1.24)
Calcium: 9 mg/dL (ref 8.9–10.3)
GFR, EST NON AFRICAN AMERICAN: 57 mL/min — AB (ref >60)
Glucose, Bld: 135 mg/dL — ABNORMAL HIGH (ref 65–99)
POTASSIUM: 4 mmol/L (ref 3.5–5.1)
SODIUM: 140 mmol/L (ref 135–145)
Total Bilirubin: 1.1 mg/dL (ref 0.3–1.2)
Total Protein: 7 g/dL (ref 6.5–8.1)

## 2015-10-12 LAB — CBC WITH DIFFERENTIAL/PLATELET
Basophils Absolute: 0.1 10*3/uL (ref 0–0.1)
Basophils Relative: 1 %
Eosinophils Absolute: 0.1 10*3/uL (ref 0–0.7)
Eosinophils Relative: 2 %
HCT: 43 % (ref 40.0–52.0)
HEMOGLOBIN: 13.7 g/dL (ref 13.0–18.0)
LYMPHS ABS: 0.7 10*3/uL — AB (ref 1.0–3.6)
LYMPHS PCT: 13 %
MCH: 29.9 pg (ref 26.0–34.0)
MCHC: 31.8 g/dL — AB (ref 32.0–36.0)
MCV: 94.2 fL (ref 80.0–100.0)
MONOS PCT: 12 %
Monocytes Absolute: 0.6 10*3/uL (ref 0.2–1.0)
NEUTROS PCT: 72 %
Neutro Abs: 3.8 10*3/uL (ref 1.4–6.5)
Platelets: 127 10*3/uL — ABNORMAL LOW (ref 150–440)
RBC: 4.57 MIL/uL (ref 4.40–5.90)
RDW: 14.9 % — ABNORMAL HIGH (ref 11.5–14.5)
WBC: 5.3 10*3/uL (ref 3.8–10.6)

## 2015-10-12 LAB — BRAIN NATRIURETIC PEPTIDE: B Natriuretic Peptide: 398 pg/mL — ABNORMAL HIGH (ref 0.0–100.0)

## 2015-10-12 LAB — MAGNESIUM: MAGNESIUM: 2.1 mg/dL (ref 1.7–2.4)

## 2015-10-12 LAB — TROPONIN I: Troponin I: <0.03 ng/mL (ref <0.031)

## 2015-10-12 MED ORDER — VITAMIN B-12 1000 MCG PO TABS
1000.0000 ug | ORAL_TABLET | Freq: Every day | ORAL | Status: DC
  Administered 2015-10-12 – 2015-10-14 (×3): 1000 ug via ORAL
  Filled 2015-10-12 (×3): qty 1

## 2015-10-12 MED ORDER — ALLOPURINOL 100 MG PO TABS
100.0000 mg | ORAL_TABLET | Freq: Every day | ORAL | Status: DC
  Administered 2015-10-13 – 2015-10-14 (×2): 100 mg via ORAL
  Filled 2015-10-12 (×2): qty 1

## 2015-10-12 MED ORDER — DOCUSATE SODIUM 100 MG PO CAPS
100.0000 mg | ORAL_CAPSULE | Freq: Every evening | ORAL | Status: DC
  Administered 2015-10-13: 100 mg via ORAL
  Filled 2015-10-12: qty 1

## 2015-10-12 MED ORDER — FERROUS SULFATE 325 (65 FE) MG PO TABS
325.0000 mg | ORAL_TABLET | Freq: Every day | ORAL | Status: DC
  Administered 2015-10-12 – 2015-10-13 (×2): 325 mg via ORAL
  Filled 2015-10-12 (×2): qty 1

## 2015-10-12 MED ORDER — SODIUM CHLORIDE 0.9 % IV SOLN
Freq: Once | INTRAVENOUS | Status: AC
  Administered 2015-10-12: 18:00:00 via INTRAVENOUS

## 2015-10-12 MED ORDER — SENNOSIDES-DOCUSATE SODIUM 8.6-50 MG PO TABS: 1.0000 | ORAL_TABLET | Freq: Every evening | ORAL | Status: DC | PRN

## 2015-10-12 MED ORDER — ACETAMINOPHEN 325 MG PO TABS: 650.0000 mg | ORAL_TABLET | Freq: Four times a day (QID) | ORAL | Status: DC | PRN

## 2015-10-12 MED ORDER — ONDANSETRON HCL 4 MG/2ML IJ SOLN: 4.0000 mg | Freq: Four times a day (QID) | INTRAMUSCULAR | Status: DC | PRN

## 2015-10-12 MED ORDER — DONEPEZIL HCL 5 MG PO TABS
5.0000 mg | ORAL_TABLET | Freq: Every day | ORAL | Status: DC
  Administered 2015-10-12 – 2015-10-13 (×2): 5 mg via ORAL
  Filled 2015-10-12 (×2): qty 1

## 2015-10-12 MED ORDER — ONDANSETRON HCL 4 MG PO TABS: 4.0000 mg | ORAL_TABLET | Freq: Four times a day (QID) | ORAL | Status: DC | PRN

## 2015-10-12 MED ORDER — NITROGLYCERIN 0.4 MG SL SUBL
0.4000 mg | SUBLINGUAL_TABLET | SUBLINGUAL | Status: DC | PRN
Start: 2015-10-12 — End: 2015-10-14

## 2015-10-12 MED ORDER — ASPIRIN EC 81 MG PO TBEC
81.0000 mg | DELAYED_RELEASE_TABLET | Freq: Every day | ORAL | Status: DC
  Administered 2015-10-13 – 2015-10-14 (×2): 81 mg via ORAL
  Filled 2015-10-12 (×2): qty 1

## 2015-10-12 MED ORDER — METOPROLOL TARTRATE 25 MG PO TABS
25.0000 mg | ORAL_TABLET | Freq: Two times a day (BID) | ORAL | Status: DC
  Administered 2015-10-12 – 2015-10-14 (×4): 25 mg via ORAL
  Filled 2015-10-12 (×4): qty 1

## 2015-10-12 MED ORDER — ACETAMINOPHEN 650 MG RE SUPP: 650.0000 mg | Freq: Four times a day (QID) | RECTAL | Status: DC | PRN

## 2015-10-12 MED ORDER — POTASSIUM CHLORIDE ER 10 MEQ PO TBCR
10.0000 meq | EXTENDED_RELEASE_TABLET | Freq: Two times a day (BID) | ORAL | Status: DC
  Administered 2015-10-12 – 2015-10-14 (×4): 10 meq via ORAL
  Filled 2015-10-12 (×9): qty 1

## 2015-10-12 MED ORDER — TAMSULOSIN HCL 0.4 MG PO CAPS
0.4000 mg | ORAL_CAPSULE | Freq: Every day | ORAL | Status: DC
  Administered 2015-10-12 – 2015-10-13 (×2): 0.4 mg via ORAL
  Filled 2015-10-12 (×2): qty 1

## 2015-10-12 MED ORDER — ENOXAPARIN SODIUM 40 MG/0.4ML ~~LOC~~ SOLN
40.0000 mg | SUBCUTANEOUS | Status: DC
  Administered 2015-10-12 – 2015-10-13 (×2): 40 mg via SUBCUTANEOUS
  Filled 2015-10-12 (×2): qty 0.4

## 2015-10-12 NOTE — H&P (Signed)
[facility] [place] at [facility]   PATIENT NAME: Gregory [name]    MR#:  [id number]  DATE OF BIRTH:  03/06/1931  DATE OF ADMISSION:  10/12/2015  PRIMARY CARE PHYSICIAN: Leanna SatoMILES,LINDA M, MD   REQUESTING/REFERRING PHYSICIAN: Dr Mayford KnifeWilliams  CHIEF COMPLAINT:   Per patient's sounds syncopal episode today. HISTORY OF PRESENT ILLNESS:  Gregory [name]  is a 81 y.o. male with a known history of Chronic combined systolic diastolic CHF, cardiac myopathy with EF of 45%, iron deficiency anemia, sleep apnea, dementia, CAD, status post permanent pacemaker and February of this year comes to the emergency room after he had a syncopal episode at home according to patient's son. Patient was just discharged 10/08/2015 with similar symptoms. His pacemaker was interrogated and essentially was normal. According to the son patient was sitting in the pickup truck today. He was trying to put the seatbelt on next thing he found patient will had rolled up his eyes and had a syncopal episode. He turned around quickly with EMS. He was brought to the emergency room he is medically stable other than his systolic blood pressures but on the lower side with anywhere from 105-110. Heart rate is around 106. His pacemaker was interrogated and no abnormal rhythm was noted. Year physician spoke with cardiology and they wanted to add Cardizem CD 12/09/2009 daily however with systolic blood pressure in the lower side ER physician has not given Cardizem. Patient's metoprolol was recently increased to 25 mg twice a day.  PAST MEDICAL HISTORY:   Past Medical History  Diagnosis Date  . Chronic systolic CHF (congestive heart failure) (HCC)     EF of 45% with diastolic dysfunction, grade I  . History of anemia     secondary to multiple AVMs  . Obstructive sleep apnea   . Coronary artery disease     s/p CABG in 2008 and a recent catheterization which showed no new occlusion   . Hypertension   .  Hyperlipidemia   . BPH (benign prostatic hyperplasia)     PAST SURGICAL HISTOIRY:   Past Surgical History  Procedure Laterality Date  . Coronary artery bypass graft  2008    x2, negative cath about 8 months ago  . Insert / replace / remove pacemaker    . Refractive surgery    . Hemorrhoid surgery      SOCIAL HISTORY:   Social History  Substance Use Topics  . Smoking status: Former Smoker    Types: Cigarettes    Quit date: 11/20/1982  . Smokeless tobacco: Never Used  . Alcohol Use: No    FAMILY HISTORY:   Family History  Problem Relation Age of Onset  . Coronary artery disease Sister   . Cancer Mother     gastric  . Lung cancer Brother     DRUG ALLERGIES:   Allergies  Allergen Reactions  . Cortisone Hives  . Penicillins Hives and Other (See Comments)    Unable to obtain enough information to answer additional questions about this medication.      REVIEW OF SYSTEMS:  Review of Systems  Unable to perform ROS: dementia     MEDICATIONS AT HOME:   Prior to Admission medications   Medication Sig Start Date End Date Taking? Authorizing Provider  allopurinol (ZYLOPRIM) 100 MG tablet Take 100 mg by mouth daily.   Yes Historical Provider, MD  aspirin 81 MG EC tablet Take 81 mg by mouth daily.     Yes Historical Provider, MD  docusate sodium (COLACE) 100 MG capsule Take 100 mg by mouth every evening.   Yes Historical Provider, MD  donepezil (ARICEPT) 5 MG tablet Take 5 mg by mouth at bedtime.   Yes Historical Provider, MD  ferrous sulfate 325 (65 FE) MG tablet Take 325 mg by mouth at bedtime.    Yes Historical Provider, MD  meclizine (ANTIVERT) 12.5 MG tablet Take 12.5 mg by mouth 3 (three) times daily as needed for dizziness.    Yes Historical Provider, MD  metoprolol tartrate (LOPRESSOR) 25 MG tablet Take 1 tablet (25 mg total) by mouth 2 (two) times daily. 10/08/15  Yes Katharina Caper, MD  nitroGLYCERIN (NITROSTAT) 0.4 MG SL tablet Place 0.4 mg under the tongue  every 5 (five) minutes as needed for chest pain.   Yes Historical Provider, MD  omeprazole (PRILOSEC) 20 MG capsule Take 20 mg by mouth daily.   Yes Historical Provider, MD  potassium chloride (K-DUR) 10 MEQ tablet Take 10 mEq by mouth 2 (two) times daily.   Yes Historical Provider, MD  Tamsulosin HCl (FLOMAX) 0.4 MG CAPS Take 0.4 mg by mouth at bedtime.    Yes Historical Provider, MD  vitamin B-12 (CYANOCOBALAMIN) 1000 MCG tablet Take 1,000 mcg by mouth daily.   Yes Historical Provider, MD      VITAL SIGNS:  Blood pressure 123/92, pulse 35, temperature 98.2 F (36.8 C), temperature source Oral, resp. rate 13, height  (1.626 m), weight 77.111 kg (170 lb), SpO2 97 %.  PHYSICAL EXAMINATION:  GENERAL:  81 y.o.-year-old patient lying in the bed with no acute distress.  EYES: Pupils equal, round, reactive to light and accommodation. No scleral icterus. Extraocular muscles intact.  HEENT: Head atraumatic, normocephalic. Oropharynx and nasopharynx clear.  NECK:  Supple, no jugular venous distention. No thyroid enlargement, no tenderness.  LUNGS: Normal breath sounds bilaterally, no wheezing, rales,rhonchi or crepitation. No use of accessory muscles of respiration.  CARDIOVASCULAR: S1, S2 normal. No murmurs, rubs, or gallops.  ABDOMEN: Soft, nontender, nondistended. Bowel sounds present. No organomegaly or mass.  EXTREMITIES: No pedal edema, cyanosis, or clubbing.  NEUROLOGIC: Cranial nerves II through XII are intact. Muscle strength 5/5 in all extremities. Sensation intact. Gait not checked.  PSYCHIATRIC: The patient is alert  SKIN: dry laceration rash over the nose bridge,no acute lesion, or ulcer.   LABORATORY PANEL:   CBC  Recent Labs Lab 10/12/15 1221  WBC 5.3  HGB 13.7  HCT 43.0  PLT 127*   ------------------------------------------------------------------------------------------------------------------  Chemistries   Recent Labs Lab 10/12/15 1221  NA 140  K 4.0  CL  108  CO2 25  GLUCOSE 135*  BUN 24*  CREATININE 1.15  CALCIUM 9.0  AST 37  ALT 23  ALKPHOS 93  BILITOT 1.1   ------------------------------------------------------------------------------------------------------------------  Cardiac Enzymes  Recent Labs Lab 10/05/15 1936  TROPONINI <0.03   ------------------------------------------------------------------------------------------------------------------  RADIOLOGY:  Dg Chest 2 View  10/12/2015  CLINICAL DATA:  pacemaker placed in past 2 weeks and has been tired. 24% ejection fraction, paced rhythm. unable to palpate radial pulses per EMS, sbp 102. has been taken off a couple of his medications since procedure. EXAM: CHEST - 2 VIEW COMPARISON:  10/05/2015 FINDINGS: Previous CABG. Left subclavian transvenous pacemaker stable in position. Stable mild cardiomegaly. Central pulmonary vascularity with mild bilateral interstitial edema or infiltrates, slightly increased since previous exam. There is thickening of or fluid in the interlobar fissures. No pleural effusion. Cervical fixation hardware noted. Spurring in the mid thoracic spine. IMPRESSION: 1.  Stable cardiomegaly with some increase in mild bilateral interstitial edema since previous exam. Electronically Signed   By: Corlis Leak M.D.   On: 10/12/2015 13:44    EKG:   Pacemaker changes IMPRESSION AND PLAN:   Gregory [name]  is a 81 y.o. male with a known history of Chronic combined systolic diastolic CHF, cardiac myopathy with EF of 45%, iron deficiency anemia, sleep apnea, dementia, CAD, status post permanent pacemaker and February of this year comes to the emergency room after he had a syncopal episode at home according to patient's son. Patient was just discharged 10/08/2015 with similar symptoms. His pacemaker was interrogated and essentially was normal.  1.Near-syncope/syncopal episodes Etiology exact unknown. Patient's pacemaker interrogation did not reveal any  arrhythmia Systolic blood pressure somewhat on the lower side. We'll continue patient's metoprolol 25 mg twice a day. Hold off on adding Cardizem at present since blood pressures that on the lower side one to avoid hypotension. Cardiology consultation Physical therapy to see patient to assess gait ambulation. Patient typically U does not use any walker or cane although he is recommended to do so in the past.  2. Cardiomyopathy EF of 45% by echo in the past No signs symptoms of CHF continue home meds  3. Mild dementia T Aricept  4.. Hyperlipidemia on statins  5. DVT prophylaxis subcutaneous Lovenox  All the records are reviewed and case discussed with ED provider. Management plans discussed with the patient, family and they are in agreement.  CODE STATUS: Full ( according to patient's son's)  TOTAL TIME TAKING CARE OF THIS PATIENT: 55 minutes.    Kolton Kienle M.D on 10/12/2015 at 3:59 PM  Between 7am to 6pm - Pager - [phone]  After 6pm go to [email or web address] - password [email or web address] [facility]  [place] [facility]  Office  [phone]  CC: Primary care physician; Leanna Sato, MD

## 2015-10-12 NOTE — ED Provider Notes (Signed)
[facility] Emergency Department Provider Note     Time seen: ----------------------------------------- 12:22 PM on 10/12/2015 -----------------------------------------    I have reviewed the triage vital signs and the nursing notes.   HISTORY  Chief Complaint Fatigue; Weakness; and Loss of Consciousness    HPI Gregory [name] is a 81 y.o. male who presents ER for fatigue and sudden loss of consciousness. Family member states he just slumped over suddenly. Patient was not feeling poorly beforehand and does not feel poorly now. Recently admitted the hospital for syncope. Pacemaker was placed in February of this year, denies any recent illness.Patient also syncopized 2 days ago for no apparent reason.   Past Medical History  Diagnosis Date  . Chronic systolic CHF (congestive heart failure) (HCC)     EF of 45% with diastolic dysfunction, grade I  . History of anemia     secondary to multiple AVMs  . Obstructive sleep apnea   . Coronary artery disease     s/p CABG in 2008 and a recent catheterization which showed no new occlusion   . Hypertension   . Hyperlipidemia   . BPH (benign prostatic hyperplasia)     Patient Active Problem List   Diagnosis Date Noted  . Chronic combined systolic and diastolic CHF (congestive heart failure) (HCC) 10/08/2015  . Tachycardia 10/08/2015  . Atrial fibrillation with RVR (HCC) 10/08/2015  . Hypotension 10/08/2015  . Pancytopenia (HCC) 10/08/2015  . Near syncope 10/05/2015  . Arrhythmia 10/05/2015  . Chronic systolic CHF (congestive heart failure) (HCC) 10/05/2015  . CAD (coronary artery disease) 10/05/2015  . HTN (hypertension) 10/05/2015  . HLD (hyperlipidemia) 10/05/2015  . BPH (benign prostatic hyperplasia) 10/05/2015    Past Surgical History  Procedure Laterality Date  . Coronary artery bypass graft  2008    x2, negative cath about 8 months ago  . Insert / replace / remove pacemaker    . Refractive  surgery    . Hemorrhoid surgery      Allergies Cortisone and Penicillins  Social History Social History  Substance Use Topics  . Smoking status: Former Smoker    Types: Cigarettes    Quit date: 11/20/1982  . Smokeless tobacco: Never Used  . Alcohol Use: No    Review of Systems Constitutional: Negative for fever. Eyes: Negative for visual changes. ENT: Negative for sore throat. Cardiovascular: Negative for chest pain. Respiratory: Negative for shortness of breath. Gastrointestinal: Negative for abdominal pain, vomiting and diarrhea. Genitourinary: Negative for dysuria. Musculoskeletal: Negative for back pain. Skin: Negative for rash. Neurological: Negative for headaches, positive for weakness  10-point ROS otherwise negative.  ____________________________________________   PHYSICAL EXAM:  VITAL SIGNS: ED Triage Vitals  Enc Vitals Group     BP 10/12/15 1206 97/69 mmHg     Pulse Rate 10/12/15 1206 62     Resp 10/12/15 1206 18     Temp 10/12/15 1206 98.2 F (36.8 C)     Temp Source 10/12/15 1206 Oral     SpO2 10/12/15 1202 97 %     Weight 10/12/15 1206 170 lb (77.111 kg)     Height 10/12/15 1206 5\' 4"  (1.626 m)     Head Cir --      Peak Flow --      Pain Score 10/12/15 1207 0     Pain Loc --      Pain Edu? --      Excl. in GC? --     Constitutional: Alert and oriented. Well  appearing and in no distress. Eyes: Conjunctivae are normal. PERRL. Normal extraocular movements. ENT   Head: Normocephalic and atraumatic.   Nose: No congestion/rhinnorhea.   Mouth/Throat: Mucous membranes are moist.   Neck: No stridor. Cardiovascular: Irregularly irregular rhythm. Normal and symmetric distal pulses are present in all extremities. No murmurs, rubs, or gallops. Respiratory: Normal respiratory effort without tachypnea nor retractions. Breath sounds are clear and equal bilaterally. No wheezes/rales/rhonchi. Gastrointestinal: Soft and nontender. No distention.  No abdominal bruits.  Musculoskeletal: Nontender with normal range of motion in all extremities. No joint effusions.  No lower extremity tenderness nor edema. Neurologic:  Normal speech and language. No gross focal neurologic deficits are appreciated. Speech is normal. No gait instability. Skin:  Skin is warm, dry and intact. No rash noted. Psychiatric: Mood and affect are normal. Speech and behavior are normal. Patient exhibits appropriate insight and judgment. ____________________________________________  EKG: Interpreted by me. AV dual paced rhythm with a rate of 114 bpm.  ____________________________________________  ED COURSE:  Pertinent labs & imaging results that were available during my care of the patient were reviewed by me and considered in my medical decision making (see chart for details). Patient's no acute distress, will need his pacemaker interrogated. We'll check cardiac labs and reevaluate. ____________________________________________    LABS (pertinent positives/negatives)  Labs Reviewed  COMPREHENSIVE METABOLIC PANEL - Abnormal; Notable for the following:    Glucose, Bld 135 (*)    BUN 24 (*)    GFR calc non Af Amer 57 (*)    All other components within normal limits  CBC WITH DIFFERENTIAL/PLATELET - Abnormal; Notable for the following:    MCHC 31.8 (*)    RDW 14.9 (*)    Platelets 127 (*)    Lymphs Abs 0.7 (*)    All other components within normal limits  CULTURE, BLOOD (ROUTINE X 2)  CULTURE, BLOOD (ROUTINE X 2)  URINE CULTURE  URINALYSIS COMPLETEWITH MICROSCOPIC ([facility] ONLY)  BRAIN NATRIURETIC PEPTIDE  TROPONIN I    RADIOLOGY Images were viewed by me  Chest x-ray IMPRESSION: 1. Stable cardiomegaly with some increase in mild bilateral interstitial edema since previous exam. ____________________________________________  FINAL ASSESSMENT AND PLAN  Syncope  Plan: Patient with labs and imaging as dictated above. Patient with several syncopal  events over the last 48 hours. Etiology here, pacemaker is been interrogated without any unstable dysrhythmia. He remains tachycardic, I discussed with cardiology however, the family wishes to readmit him for observation.   Emily Filbert, MD   Emily Filbert, MD 10/12/15 [phone]

## 2015-10-12 NOTE — ED Notes (Signed)
Was sitting in pickup and had near syncope episode, has dementia,

## 2015-10-12 NOTE — ED Notes (Signed)
Called to give report , given phone number to call back

## 2015-10-12 NOTE — Progress Notes (Addendum)
Pt. admitted to 2A from ED, rm 259. Report received from [place] RN in ED. Patient is confused and was oriented to room, call bell, Ascom phones and staff as best as possible. Bed in low position. Fall safety plan reviewed, contract signed and placed on wall, yellow non-skid socks in place, bed alarm on. Son and brother at bedside, verbalize understanding of fall safety plan. Full assessment to Epic. Skin assessed with Gloris ManchesterLeah Lee, RN; pt has various abrasions from recent falls on hands and bridge of nose and scattered ecchymosis. Telemetry box MX40-06 placed and verified with tele clerk, patient is reading v-paced in mid-100s. Dynamap is reading HR in the 30s, but is not picking up pacer beats, will use telemetry for accurate HR recording. Will continue to monitor.

## 2015-10-12 NOTE — ED Notes (Signed)
Pt reminded of urine sample, pt will try soon 

## 2015-10-12 NOTE — ED Notes (Signed)
Patient transported to X-ray 

## 2015-10-12 NOTE — ED Notes (Signed)
Per [facility]: pacemaker placed in past 2 weeks and has been tired.  24% ejection fraction, paced rhythm.  unable to palpate radial pulses per EMS, sbp 102.  has been taken off a couple of his medications since procedure.   20 guage in left forearm.  lethargic with ems.  cbg 114. HR 120, paced.  EMS states family called EMS bc pt has been lethargic since pacemaker was placed but were unable to give any other specific reasons as to why patient need emergent transport to the ER.  Patient denies pain anywhere on his body, denies chest pain and shortness of breath and only complains of intermittent "rumbling" in his belly at times.  Is in a ventricular paced rhythm with a rate of 110-120

## 2015-10-12 NOTE — Care Management (Signed)
Patient admitted under observation for syncope.  Notified [facility] of admission.

## 2015-10-12 NOTE — ED Notes (Signed)
Lab called to add on BNP and troponin

## 2015-10-13 LAB — URINALYSIS COMPLETE WITH MICROSCOPIC (ARMC ONLY)
BILIRUBIN URINE: NEGATIVE
Bacteria, UA: NONE SEEN
GLUCOSE, UA: NEGATIVE mg/dL
Hgb urine dipstick: NEGATIVE
Ketones, ur: NEGATIVE mg/dL
Leukocytes, UA: NEGATIVE
NITRITE: NEGATIVE
PH: 5 (ref 5.0–8.0)
PROTEIN: 100 mg/dL — AB
Specific Gravity, Urine: 1.028 (ref 1.005–1.030)

## 2015-10-13 NOTE — Evaluation (Signed)
Physical Therapy Evaluation Patient Details Name: Gregory [name] MRN: [id number] DOB: 03/06/1931 Today's Date: 10/13/2015   History of Present Illness  81 yo male with onset of weakness and difficulty moving with new pacemaker recently and now having control issues of pacer function.  Syncopal episodes with new device.  Hx:  CHF, OSA, dementia, EF 45%.    Clinical Impression  Pt is getting up to walk with PT and noted some struggle to control his pace although this is apparently his PLOF.  Pt uses crutch with son when son can get pt to agree, and the IV pole basically served this purpose.  Expect he will go home and continue his movement as tolerated, but will not have follow up therapy for now.    Follow Up Recommendations No PT follow up    Equipment Recommendations  None recommended by PT    Recommendations for Other Services       Precautions / Restrictions Precautions Precautions: Fall Restrictions Weight Bearing Restrictions: No      Mobility  Bed Mobility Overal bed mobility: Needs Assistance Bed Mobility: Supine to Sit;Sit to Supine     Supine to sit: Min assist Sit to supine: Min assist   General bed mobility comments: Pt is getting up to bedside with assist mainly for lines as he does not attend to them  Transfers Overall transfer level: Modified independent Equipment used: None Transfers: Sit to/from [employer] Sit to Stand: Min guard Stand pivot transfers: Min guard (IV pole)       General transfer comment: hands to IV pole for support  Ambulation/Gait Ambulation/Gait assistance: Min guard Ambulation Distance (Feet): 300 Feet Assistive device: 1 person hand held assist (IV pole) Gait Pattern/deviations: Step-through pattern;Drifts right/left;Wide base of support;Shuffle Gait velocity: reduced Gait velocity interpretation: Below normal speed for age/gender General Gait Details: reciprocal stepping pattern, slightly staggernig at  times but able to self-correct with LE step strategy.  Slow, but fairly steady, cadence and overall gait speed.  Stairs            Wheelchair Mobility    Modified Rankin (Stroke Patients Only)       Balance Overall balance assessment: Needs assistance Sitting-balance support: Feet supported Sitting balance-Leahy Scale: Good     Standing balance support: Bilateral upper extremity supported Standing balance-Leahy Scale: Fair                               Pertinent Vitals/Pain Pain Assessment: No/denies pain    Home Living Family/patient expects to be discharged to:: Private residence Living Arrangements: Other relatives Available Help at Discharge: Family Type of Home: Mobile home Home Access: Stairs to enter Entrance Stairs-Rails: Right Entrance Stairs-Number of Steps: 2 Home Layout: One level Home Equipment: Crutches      Prior Function Level of Independence: Independent         Comments: Indep with household mobility, ADLs; denies fall history outside of this admission.     Hand Dominance        Extremity/Trunk Assessment   Upper Extremity Assessment: Defer to OT evaluation           Lower Extremity Assessment: Overall WFL for tasks assessed      Cervical / Trunk Assessment: Normal  Communication   Communication: HOH  Cognition Arousal/Alertness: Awake/alert Behavior During Therapy: WFL for tasks assessed/performed Overall Cognitive Status: History of cognitive impairments - at baseline  Memory: Decreased short-term memory              General Comments      Exercises        Assessment/Plan    PT Assessment Patent does not need any further PT services  PT Diagnosis Generalized weakness   PT Problem List Decreased strength;Decreased range of motion;Decreased activity tolerance;Decreased balance;Decreased mobility;Decreased coordination;Decreased cognition;Decreased knowledge of use of DME;Decreased safety  awareness;Decreased knowledge of precautions;Cardiopulmonary status limiting activity;Decreased skin integrity  PT Treatment Interventions     PT Goals (Current goals can be found in the Care Plan section) Acute Rehab PT Goals Patient Stated Goal: "to go home today"    Frequency     Barriers to discharge Inaccessible home environment;Decreased caregiver support confusion requiring continual support    Co-evaluation               End of Session Equipment Utilized During Treatment: Gait belt Activity Tolerance: Patient tolerated treatment well Patient left: in bed;with call bell/phone within reach;with bed alarm set Nurse Communication: Mobility status    Functional Assessment Tool Used: clincal judgement, 10' walk time Functional Limitation: Mobility: Walking and moving around Mobility: Walking and Moving Around Current Status ([id number]): At least 20 percent but less than 40 percent impaired, limited or restricted Mobility: Walking and Moving Around Goal Status [phone]): At least 20 percent but less than 40 percent impaired, limited or restricted    Time: [id number] PT Time Calculation (min) (ACUTE ONLY): 36 min   Charges:   PT Evaluation $Initial PT Evaluation Tier I: 1 Procedure PT Treatments $Gait Training: 8-22 mins   PT G Codes:   PT G-Codes **NOT FOR INPATIENT CLASS** Functional Assessment Tool Used: clincal judgement, 10' walk time Functional Limitation: Mobility: Walking and moving around Mobility: Walking and Moving Around Current Status ([id number](G8978): At least 20 percent but less than 40 percent impaired, limited or restricted Mobility: Walking and Moving Around Goal Status [phone](G8979): At least 20 percent but less than 40 percent impaired, limited or restricted    Ivar DrapeStout, Kloe Oates E 10/13/2015, 5:52 PM   Samul Dadauth Destiny Trickey, PT MS Acute Rehab Dept. Number: [facility] [phone] and [facility] [phone]

## 2015-10-13 NOTE — Consult Note (Signed)
[facility] Cardiology  CARDIOLOGY CONSULT NOTE  Patient ID: Gregory [name] MRN: [id number] DOB/AGE: 79/17/1932 79 y.o.  Admit date: 10/12/2015 Referring Physician Winona Legato Primary Physician [facility] Primary Cardiologist [facility] Reason for Consultation syncope  HPI: 81 year old gentleman referred for evaluation of syncope. The patient has known mild cardio myopathy, chronic systolic congestive heart failure, sick sinus syndrome status post pacemaker. Atrial fibrillation of recent syncopal episode, hospitalized and discharged 10/08/59 at which time pacemaker interrogation revealed normal pacemaker function. 10/12/15, patient was briefly found slumped over in his car, presented to [facility] emergency room where again pacemaker interrogation revealed normal pacemaker function. Patient has ruled out for myocardial infarction by CPK isoenzymes and troponin. She currently denies chest pain, palpitations or heart racing. The patient does have baseline dementia.  Review of systems complete and found to be negative unless listed above     Past Medical History  Diagnosis Date  . Chronic systolic CHF (congestive heart failure) (HCC)     EF of 45% with diastolic dysfunction, grade I  . History of anemia     secondary to multiple AVMs  . Obstructive sleep apnea   . Coronary artery disease     s/p CABG in 2008 and a recent catheterization which showed no new occlusion   . Hypertension   . Hyperlipidemia   . BPH (benign prostatic hyperplasia)     Past Surgical History  Procedure Laterality Date  . Coronary artery bypass graft  2008    x2, negative cath about 8 months ago  . Insert / replace / remove pacemaker    . Refractive surgery    . Hemorrhoid surgery      Prescriptions prior to admission  Medication Sig Dispense Refill Last Dose  . allopurinol (ZYLOPRIM) 100 MG tablet Take 100 mg by mouth daily.   10/12/2015 at Unknown time  . aspirin 81 MG EC tablet Take 81 mg by mouth daily.     10/12/2015 at  0800  . docusate sodium (COLACE) 100 MG capsule Take 100 mg by mouth every evening.   10/11/2015 at Unknown time  . donepezil (ARICEPT) 5 MG tablet Take 5 mg by mouth at bedtime.   10/11/2015 at Unknown time  . ferrous sulfate 325 (65 FE) MG tablet Take 325 mg by mouth at bedtime.    10/11/2015 at Unknown time  . meclizine (ANTIVERT) 12.5 MG tablet Take 12.5 mg by mouth 3 (three) times daily as needed for dizziness.    10/12/2015 at Unknown time  . metoprolol tartrate (LOPRESSOR) 25 MG tablet Take 1 tablet (25 mg total) by mouth 2 (two) times daily. 60 tablet 6 10/12/2015 at 0800  . nitroGLYCERIN (NITROSTAT) 0.4 MG SL tablet Place 0.4 mg under the tongue every 5 (five) minutes as needed for chest pain.   PRN at PRN  . omeprazole (PRILOSEC) 20 MG capsule Take 20 mg by mouth daily.   10/12/2015 at Unknown time  . potassium chloride (K-DUR) 10 MEQ tablet Take 10 mEq by mouth 2 (two) times daily.   10/12/2015 at Unknown time  . Tamsulosin HCl (FLOMAX) 0.4 MG CAPS Take 0.4 mg by mouth at bedtime.    10/11/2015 at Unknown time  . vitamin B-12 (CYANOCOBALAMIN) 1000 MCG tablet Take 1,000 mcg by mouth daily.   10/12/2015 at Unknown time   Social History   Social History  . Marital Status: Widowed    Spouse Name: N/A  . Number of Children: N/A  . Years of Education: N/A   Occupational History  .  Not on file.   Social History Main Topics  . Smoking status: Former Smoker    Types: Cigarettes    Quit date: 11/20/1982  . Smokeless tobacco: Never Used  . Alcohol Use: No  . Drug Use: No  . Sexual Activity: No   Other Topics Concern  . Not on file   Social History Narrative    Family History  Problem Relation Age of Onset  . Coronary artery disease Sister   . Cancer Mother     gastric  . Lung cancer Brother       Review of systems complete and found to be negative unless listed above      PHYSICAL EXAM  General: Well developed, well nourished, in no acute distress HEENT:   Normocephalic and atramatic Neck:  No JVD.  Lungs: Clear bilaterally to auscultation and percussion. Heart: HRRR . Normal S1 and S2 without gallops or murmurs.  Abdomen: Bowel sounds are positive, abdomen soft and non-tender  Msk:  Back normal, normal gait. Normal strength and tone for age. Extremities: No clubbing, cyanosis or edema.   Neuro: Alert and oriented X 3. Psych:  Good affect, responds appropriately  Labs:   Lab Results  Component Value Date   WBC 5.3 10/12/2015   HGB 13.7 10/12/2015   HCT 43.0 10/12/2015   MCV 94.2 10/12/2015   PLT 127* 10/12/2015    Recent Labs Lab 10/12/15 1221  NA 140  K 4.0  CL 108  CO2 25  BUN 24*  CREATININE 1.15  CALCIUM 9.0  PROT 7.0  BILITOT 1.1  ALKPHOS 93  ALT 23  AST 37  GLUCOSE 135*   Lab Results  Component Value Date   CKMB 2.0 08/03/2012   TROPONINI <0.03 10/12/2015    Lab Results  Component Value Date   CHOL 114 08/05/2012   Lab Results  Component Value Date   HDL 13* 08/05/2012   Lab Results  Component Value Date   LDLCALC 72 08/05/2012   Lab Results  Component Value Date   TRIG 143 08/05/2012   No results found for: CHOLHDL No results found for: LDLDIRECT    Radiology: Dg Chest 2 View  10/12/2015  CLINICAL DATA:  pacemaker placed in past 2 weeks and has been tired. 24% ejection fraction, paced rhythm. unable to palpate radial pulses per EMS, sbp 102. has been taken off a couple of his medications since procedure. EXAM: CHEST - 2 VIEW COMPARISON:  10/05/2015 FINDINGS: Previous CABG. Left subclavian transvenous pacemaker stable in position. Stable mild cardiomegaly. Central pulmonary vascularity with mild bilateral interstitial edema or infiltrates, slightly increased since previous exam. There is thickening of or fluid in the interlobar fissures. No pleural effusion. Cervical fixation hardware noted. Spurring in the mid thoracic spine. IMPRESSION: 1. Stable cardiomegaly with some increase in mild bilateral  interstitial edema since previous exam. Electronically Signed   By: Corlis Leak  Hassell M.D.   On: 10/12/2015 13:44   [place] Carotid Bilateral  10/06/2015  CLINICAL DATA:  Stroke.  Hypertension, syncope, coronary disease. EXAM: BILATERAL CAROTID DUPLEX ULTRASOUND TECHNIQUE: Wallace CullensGray scale imaging, color Doppler and duplex ultrasound was performed of bilateral carotid and vertebral arteries in the neck. COMPARISON:  01/15/2015 REVIEW OF SYSTEMS: Quantification of carotid stenosis is based on velocity parameters that correlate the residual internal carotid diameter with NASCET-based stenosis levels, using the diameter of the distal internal carotid lumen as the denominator for stenosis measurement. The following velocity measurements were obtained: PEAK SYSTOLIC/END DIASTOLIC RIGHT ICA:  78/21cm/sec CCA:                     66/14cm/sec SYSTOLIC ICA/CCA RATIO:  1.2 DIASTOLIC ICA/CCA RATIO: 1.5 ECA:                     74cm/sec LEFT ICA:                     76/24cm/sec CCA:                     65/13cm/sec SYSTOLIC ICA/CCA RATIO:  1.2 DIASTOLIC ICA/CCA RATIO: 1.9 ECA:                     119cm/sec FINDINGS: RIGHT CAROTID ARTERY: Scattered nonocclusive plaque through the common carotid artery again bulb into the proximal ICA. No high-grade stenosis. Normal waveforms and color Doppler signal. RIGHT VERTEBRAL ARTERY:  Normal flow direction and waveform. LEFT CAROTID ARTERY: Scattered plaque through the common carotid artery, circumferentially in the bulb and proximal ICA, without high-grade stenosis. Normal waveforms and color Doppler signal. LEFT VERTEBRAL ARTERY: Normal flow direction and waveform. IMPRESSION: 1. Extensive bilateral carotid plaque resulting in less than 50% diameter stenosis bilaterally. The exam does not exclude plaque ulceration or embolization. Continued surveillance recommended. Electronically Signed   By: Corlis Leak M.D.   On: 10/06/2015 18:21   Dg Chest Portable 1 View  10/05/2015   CLINICAL DATA:  Dizziness today.  Syncope. EXAM: PORTABLE CHEST 1 VIEW COMPARISON:  09/09/2015 FINDINGS: Sternotomy wires and left-sided pacemaker unchanged. Lungs are adequately inflated with no evidence of consolidation or effusion. Moderate stable cardiomegaly. Remainder of the exam is unchanged. IMPRESSION: No acute cardiopulmonary disease. Stable cardiomegaly. Electronically Signed   By: Elberta Fortis M.D.   On: 10/05/2015 19:54    EKG: Ventricular pacing  ASSESSMENT AND PLAN:   1. Recurrent syncopal episodes, with atypical features, sick sinus syndrome, status post pacemaker, with pacemaker function. Pacemaker interrogation did not reveal evidence for significant bradycardia or tachyarrhythmia.  Recommendations  1. Agree with current therapy 2. Consider discharge home , outpatient loop monitor with Dr. Sherlyn Hay    Signed: Marcina Millard MD,PhD, [facility] 10/13/2015, 1:05 PM

## 2015-10-13 NOTE — Care Management (Signed)
Patient present from home.  Recent discharge 11/18 after observation stay.  Presented this admission after syncope episode where patient "slumped over."  Pacemaker interrogation in progress

## 2015-10-13 NOTE — Care Management Obs Status (Addendum)
MEDICARE OBSERVATION STATUS NOTIFICATION   Patient Details  Name: Gregory [name] MRN: [id number] Date of Birth: 03/06/1931   Medicare Observation Status Notification Given:  Yes  Patient admitted under observation.  Presented and explained observation notice.  Daughter in law  signed.  Original given to patient and copy placed in medical record.  Copy manually delivered to HIM.  Faxed to [employer]     Jasmaine Rochel, Roseanne KaufmanNann R, RN 10/13/2015, 8:23 AM

## 2015-10-13 NOTE — Progress Notes (Signed)
Patient was hemodynamically stable w/o any acute event  overnight. VS remained WDL for patient. Patient was redirected and oriented as needed for mild confusion. He was assisted with walking on the unit, and he tolerated the walking well. He rested for most of the night and was provided bathroom assisted with safety check and as needed.

## 2015-10-13 NOTE — Progress Notes (Signed)
[facility] [place] at [facility]   PATIENT NAME: Gregory [name]    MR#:  [id number]  DATE OF BIRTH:  03/06/1931  SUBJECTIVE:  CHIEF COMPLAINT:   Chief Complaint  Patient presents with  . Fatigue    s/p pacemaker insertion in past 2 weeks.  . Weakness  . Loss of Consciousness    sunday   feels good. Denies any pain. Was noted to have it repeated syncopal episode of on Sunday while sitting in the truck. Brought to emergency room. Heart rate is in 80s to 100s. Pacemaker is functioning well  Review of Systems  Constitutional: Negative for fever, chills and weight loss.  HENT: Negative for congestion.   Eyes: Negative for blurred vision and double vision.  Respiratory: Negative for cough, sputum production, shortness of breath and wheezing.   Cardiovascular: Negative for chest pain, palpitations, orthopnea, leg swelling and PND.  Gastrointestinal: Negative for nausea, vomiting, abdominal pain, diarrhea, constipation and blood in stool.  Genitourinary: Negative for dysuria, urgency, frequency and hematuria.  Musculoskeletal: Negative for falls.  Neurological: Negative for dizziness, tremors, focal weakness and headaches.  Endo/Heme/Allergies: Does not bruise/bleed easily.  Psychiatric/Behavioral: Negative for depression. The patient does not have insomnia.     VITAL SIGNS: Blood pressure 103/53, pulse 70, temperature 97.6 F (36.4 C), temperature source Oral, resp. rate 20, height  (1.626 m), weight 77.111 kg (170 lb), SpO2 96 %.  PHYSICAL EXAMINATION:   GENERAL:  81 y.o.-year-old patient lying in the bed with no acute distress.  EYES: Pupils equal, round, reactive to light and accommodation. No scleral icterus. Extraocular muscles intact.  HEENT: Head atraumatic, normocephalic. Oropharynx and nasopharynx clear.  NECK:  Supple, no jugular venous distention. No thyroid enlargement, no tenderness.  LUNGS: Normal breath sounds bilaterally, no  wheezing, rales,rhonchi or crepitation. No use of accessory muscles of respiration.  CARDIOVASCULAR: S1, S2 normal. No murmurs, rubs, or gallops.  ABDOMEN: Soft, nontender, nondistended. Bowel sounds present. No organomegaly or mass.  EXTREMITIES: No pedal edema, cyanosis, or clubbing.  NEUROLOGIC: Cranial nerves II through XII are intact. Muscle strength 5/5 in all extremities. Sensation intact. Gait not checked.  PSYCHIATRIC: The patient is alert and oriented x 3.  SKIN: No obvious rash, lesion, or ulcer.   ORDERS/RESULTS REVIEWED:   CBC  Recent Labs Lab 10/07/15 0353 10/08/15 0436 10/12/15 1221  WBC 3.6* 3.9 5.3  HGB 12.9* 12.4* 13.7  HCT 39.6* 38.1* 43.0  PLT 111* 109* 127*  MCV 94.4 93.3 94.2  MCH 30.8 30.4 29.9  MCHC 32.7 32.6 31.8*  RDW 15.0* 15.0* 14.9*  LYMPHSABS  --   --  0.7*  MONOABS  --   --  0.6  EOSABS  --   --  0.1  BASOSABS  --   --  0.1   ------------------------------------------------------------------------------------------------------------------  Chemistries   Recent Labs Lab 10/12/15 1221  NA 140  K 4.0  CL 108  CO2 25  GLUCOSE 135*  BUN 24*  CREATININE 1.15  CALCIUM 9.0  MG 2.1  AST 37  ALT 23  ALKPHOS 93  BILITOT 1.1   ------------------------------------------------------------------------------------------------------------------ estimated creatinine clearance is 46.5 mL/min (by C-G formula based on Cr of 1.15). ------------------------------------------------------------------------------------------------------------------ No results for input(s): TSH, T4TOTAL, T3FREE, THYROIDAB in the last 72 hours.  Invalid input(s): FREET3  Cardiac Enzymes  Recent Labs Lab 10/12/15 1221  TROPONINI <0.03   ------------------------------------------------------------------------------------------------------------------ Invalid input(s):  POCBNP ---------------------------------------------------------------------------------------------------------------  RADIOLOGY: Dg Chest 2 View  10/12/2015  CLINICAL DATA:  pacemaker placed in past 2 weeks and has been tired. 24% ejection fraction, paced rhythm. unable to palpate radial pulses per EMS, sbp 102. has been taken off a couple of his medications since procedure. EXAM: CHEST - 2 VIEW COMPARISON:  10/05/2015 FINDINGS: Previous CABG. Left subclavian transvenous pacemaker stable in position. Stable mild cardiomegaly. Central pulmonary vascularity with mild bilateral interstitial edema or infiltrates, slightly increased since previous exam. There is thickening of or fluid in the interlobar fissures. No pleural effusion. Cervical fixation hardware noted. Spurring in the mid thoracic spine. IMPRESSION: 1. Stable cardiomegaly with some increase in mild bilateral interstitial edema since previous exam. Electronically Signed   By: Corlis Leak  Hassell M.D.   On: 10/12/2015 13:44    EKG:  Orders placed or performed during the hospital encounter of 10/12/15  . EKG 12-Lead  . EKG 12-Lead    ASSESSMENT AND PLAN:  Active Problems:   Syncope 1. Recurrent syncopal episodes, etiology remains unclear. Questionable narcolepsy, Get nephrologist involved for further recommendations, supportive therapy for now 2. , Sick sinus syndrome, status post permanent pacemaker placement, continue metoprolol at 25 twice a day, decreased from prior recommendation a 50 twice a day due to hypotension likely , appreciate cardiology's recommendations , may need loop monitor as outpatient 3. Hypotension, seems to be stable. Get orthostatic vital signs 4. Thrombocytopenia, stable   Management plans discussed with the patient, family and they are in agreement.   DRUG ALLERGIES:  Allergies  Allergen Reactions  . Cortisone Hives  . Penicillins Hives and Other (See Comments)    Unable to obtain enough information to answer  additional questions about this medication.      CODE STATUS:     Code Status Orders        Start     Ordered   10/12/15 1759  Full code   Continuous     10/12/15 1758      TOTAL TIME TAKING CARE OF THIS PATIENT: 50 minutes Discussed with patient's family. Coordination of care. 15 minutes, .    Katharina CaperVAICKUTE,Lempi Edwin M.D on 10/13/2015 at 3:23 PM  Between 7am to 6pm - Pager - [phone]  After 6pm go to [email or web address] - password [email or web address] [facility]  [facility]  Office  [phone]  CC: Primary care physician; Leanna SatoMILES,LINDA M, MD

## 2015-10-13 NOTE — Progress Notes (Signed)
Initial Nutrition Assessment    INTERVENTION:   Meals and Snacks: Cater to patient preferences;  NUTRITION DIAGNOSIS:   Reassess on follow-up  GOAL:   Patient will meet greater than or equal to 90% of their needs  MONITOR:    (Energy Intake, Anthropometrics, Digestive System, Electrolyte/Renal Profile)  REASON FOR ASSESSMENT:   Malnutrition Screening Tool    ASSESSMENT:    Pt admitted with syncope  Past Medical History  Diagnosis Date  . Chronic systolic CHF (congestive heart failure) (HCC)     EF of 45% with diastolic dysfunction, grade I  . History of anemia     secondary to multiple AVMs  . Obstructive sleep apnea   . Coronary artery disease     s/p CABG in 2008 and a recent catheterization which showed no new occlusion   . Hypertension   . Hyperlipidemia   . BPH (benign prostatic hyperplasia)     Diet Order:  Diet Heart Room service appropriate?: Yes; Fluid consistency:: Thin  Energy Intake: pt eating 75-100% of meals, appetite good at present  Height:   Ht Readings from Last 1 Encounters:  10/12/15 5\' 4"  (1.626 m)    Weight: weight trend per wt encounters  Wt Readings from Last 1 Encounters:  10/12/15 170 lb (77.111 kg)   Filed Weights   10/12/15 1206  Weight: 170 lb (77.111 kg)   Wt Readings from Last 10 Encounters:  10/12/15 170 lb (77.111 kg)  10/06/15 169 lb 8 oz (76.885 kg)  09/09/15 178 lb (80.74 kg)    BMI:  Body mass index is 29.17 kg/(m^2).  LOW Care Level  Romelle Starcherate Maple Odaniel MS, [place], LDN [phone] Pager

## 2015-10-14 DIAGNOSIS — I495 Sick sinus syndrome: Secondary | ICD-10-CM

## 2015-10-14 DIAGNOSIS — D696 Thrombocytopenia, unspecified: Secondary | ICD-10-CM

## 2015-10-14 DIAGNOSIS — Z950 Presence of cardiac pacemaker: Secondary | ICD-10-CM

## 2015-10-14 LAB — URINE CULTURE

## 2015-10-14 NOTE — Care Management Note (Signed)
Case Management Note  Patient Details  Name: Gregory [name] MRN: [id number] Date of Birth: 03/06/1931  Subjective/Objective:     [facility] No new home health orders.                Action/Plan:   Expected Discharge Date:                  Expected Discharge Plan:     In-House Referral:     Discharge planning Services     Post Acute Care Choice:    Choice offered to:     DME Arranged:    DME Agency:     HH Arranged:    HH Agency:     Status of Service:     Medicare Important Message Given:    Date Medicare IM Given:    Medicare IM give by:    Date Additional Medicare IM Given:    Additional Medicare Important Message give by:     If discussed at Long Length of Stay Meetings, dates discussed:    Additional Comments:  [name] A, RN 10/14/2015, 2:48 PM

## 2015-10-14 NOTE — Progress Notes (Signed)
[facility] - [place] at [facility]   PATIENT NAME: Gregory [name]    MR#:  [id number]  DATE OF BIRTH:  03/06/1931  SUBJECTIVE:  CHIEF COMPLAINT:   Chief Complaint  Patient presents with  . Fatigue    s/p pacemaker insertion in past 2 weeks.  . Weakness  . Loss of Consciousness    sunday   feels good. Denies any pain. Was noted to have it repeated syncopal episode of on Sunday while sitting in the truck. Brought to emergency room. Heart rate is in 80s to 100s. Pacemaker is functioning well. No syncopal episode in the hospital, telemetry is unremarkable. Feels good now complains, wants to go home. Awaiting for neurologist consult.   Review of Systems  Constitutional: Negative for fever, chills and weight loss.  HENT: Negative for congestion.   Eyes: Negative for blurred vision and double vision.  Respiratory: Negative for cough, sputum production, shortness of breath and wheezing.   Cardiovascular: Negative for chest pain, palpitations, orthopnea, leg swelling and PND.  Gastrointestinal: Negative for nausea, vomiting, abdominal pain, diarrhea, constipation and blood in stool.  Genitourinary: Negative for dysuria, urgency, frequency and hematuria.  Musculoskeletal: Negative for falls.  Neurological: Negative for dizziness, tremors, focal weakness and headaches.  Endo/Heme/Allergies: Does not bruise/bleed easily.  Psychiatric/Behavioral: Negative for depression. The patient does not have insomnia.     VITAL SIGNS: Blood pressure 97/68, pulse 107, temperature 97.6 F (36.4 C), temperature source Oral, resp. rate 18, height  (1.626 m), weight 77.111 kg (170 lb), SpO2 94 %.  PHYSICAL EXAMINATION:   GENERAL:  81 y.o.-year-old patient lying in the bed with no acute distress.  EYES: Pupils equal, round, reactive to light and accommodation. No scleral icterus. Extraocular muscles intact.  HEENT: Head atraumatic, normocephalic. Oropharynx and nasopharynx  clear.  NECK:  Supple, no jugular venous distention. No thyroid enlargement, no tenderness.  LUNGS: Normal breath sounds bilaterally, no wheezing, rales,rhonchi or crepitation. No use of accessory muscles of respiration.  CARDIOVASCULAR: S1, S2 normal. No murmurs, rubs, or gallops.  ABDOMEN: Soft, nontender, nondistended. Bowel sounds present. No organomegaly or mass.  EXTREMITIES: No pedal edema, cyanosis, or clubbing.  NEUROLOGIC: Cranial nerves II through XII are intact. Muscle strength 5/5 in all extremities. Sensation intact. Gait not checked.  PSYCHIATRIC: The patient is alert and oriented x 3.  SKIN: No obvious rash, lesion, or ulcer.   ORDERS/RESULTS REVIEWED:   CBC  Recent Labs Lab 10/08/15 0436 10/12/15 1221  WBC 3.9 5.3  HGB 12.4* 13.7  HCT 38.1* 43.0  PLT 109* 127*  MCV 93.3 94.2  MCH 30.4 29.9  MCHC 32.6 31.8*  RDW 15.0* 14.9*  LYMPHSABS  --  0.7*  MONOABS  --  0.6  EOSABS  --  0.1  BASOSABS  --  0.1   ------------------------------------------------------------------------------------------------------------------  Chemistries   Recent Labs Lab 10/12/15 1221  NA 140  K 4.0  CL 108  CO2 25  GLUCOSE 135*  BUN 24*  CREATININE 1.15  CALCIUM 9.0  MG 2.1  AST 37  ALT 23  ALKPHOS 93  BILITOT 1.1   ------------------------------------------------------------------------------------------------------------------ estimated creatinine clearance is 46.5 mL/min (by C-G formula based on Cr of 1.15). ------------------------------------------------------------------------------------------------------------------ No results for input(s): TSH, T4TOTAL, T3FREE, THYROIDAB in the last 72 hours.  Invalid input(s): FREET3  Cardiac Enzymes  Recent Labs Lab 10/12/15 1221  TROPONINI <0.03   ------------------------------------------------------------------------------------------------------------------ Invalid input(s):  POCBNP ---------------------------------------------------------------------------------------------------------------  RADIOLOGY: Dg Chest 2 View  10/12/2015  CLINICAL DATA:  pacemaker placed in past 2 weeks and has been tired. 24% ejection fraction, paced rhythm. unable to palpate radial pulses per EMS, sbp 102. has been taken off a couple of his medications since procedure. EXAM: CHEST - 2 VIEW COMPARISON:  10/05/2015 FINDINGS: Previous CABG. Left subclavian transvenous pacemaker stable in position. Stable mild cardiomegaly. Central pulmonary vascularity with mild bilateral interstitial edema or infiltrates, slightly increased since previous exam. There is thickening of or fluid in the interlobar fissures. No pleural effusion. Cervical fixation hardware noted. Spurring in the mid thoracic spine. IMPRESSION: 1. Stable cardiomegaly with some increase in mild bilateral interstitial edema since previous exam. Electronically Signed   By: Corlis Leak  Hassell M.D.   On: 10/12/2015 13:44    EKG:  Orders placed or performed during the hospital encounter of 10/12/15  . EKG 12-Lead  . EKG 12-Lead    ASSESSMENT AND PLAN:  Active Problems:   Syncope 1. Recurrent syncopal episodes, etiology remains unclear. Rule out narcolepsy/cataplexy, awaiting for neurologist recommendations, likely will need outpatient polysomnography, multiple sleep latency test 2. , Sick sinus syndrome, status post permanent pacemaker placement, continue metoprolol at 25 twice a day, decreased from prior recommendation a 50 twice a day due to hypotension likely , appreciate cardiology's recommendations , may need loop monitor as outpatient 3. Hypotension, seems to be stable. Unremarkable orthostatic vital signs 4. Thrombocytopenia, stable, follow-up as outpatient   Management plans discussed with the patient, family and they are in agreement.   DRUG ALLERGIES:  Allergies  Allergen Reactions  . Cortisone Hives  . Penicillins Hives  and Other (See Comments)    Unable to obtain enough information to answer additional questions about this medication.      CODE STATUS:     Code Status Orders        Start     Ordered   10/12/15 1759  Full code   Continuous     10/12/15 1758      TOTAL TIME TAKING CARE OF THIS PATIENT: 45 minutes Discussed with patient's family.  Marland Kitchen.    Katharina CaperVAICKUTE,Nyja Westbrook M.D on 10/14/2015 at 12:07 PM  Between 7am to 6pm - Pager - [phone]  After 6pm go to [email or web address] - password [email or web address] [facility]  [facility]  Office  [phone]  CC: Primary care physician; Leanna SatoMILES,LINDA M, MD

## 2015-10-14 NOTE — Progress Notes (Signed)
Pt resting quietly throughout the shift. Family at bedside till 11:00pm. Fall risk prevention maintained. Pt denies pain.

## 2015-10-14 NOTE — Progress Notes (Signed)
A & O. V Paced. Room air. Takes meds ok. Urinal. Pt reports no pain. IV and tele removed. Discharge instructions given to pt. Pt has no further concerns at this time. Son to take home.

## 2015-10-15 NOTE — Discharge Summary (Signed)
[facility] Physicians - [place] at [facility]   PATIENT NAME: Gregory [name]    MR#:  [id number]  DATE OF BIRTH:  03/06/1931  DATE OF ADMISSION:  10/12/2015 ADMITTING PHYSICIAN: Enedina Finner, MD  DATE OF DISCHARGE: 10/14/2015  2:55 PM  PRIMARY CARE PHYSICIAN: Leanna Sato, MD     ADMISSION DIAGNOSIS:  Tachycardia [R00.0] Syncope, unspecified syncope type [R55]  DISCHARGE DIAGNOSIS:  Principal Problem:   Syncope Active Problems:   Sick sinus syndrome (HCC)   History of permanent cardiac pacemaker placement   Thrombocytopenia (HCC)   SECONDARY DIAGNOSIS:   Past Medical History  Diagnosis Date  . Chronic systolic CHF (congestive heart failure) (HCC)     EF of 45% with diastolic dysfunction, grade I  . History of anemia     secondary to multiple AVMs  . Obstructive sleep apnea   . Coronary artery disease     s/p CABG in 2008 and a recent catheterization which showed no new occlusion   . Hypertension   . Hyperlipidemia   . BPH (benign prostatic hyperplasia)     .pro HOSPITAL COURSE:  The patient was brought to the hospital after he was noted to have repeated syncopal episode while sitting in the truck. Brought to emergency room. Heart rate is in 80s to 100s. Pacemaker is functioning well. No syncopal episode in the hospital, telemetry is unremarkable, seen [facility] cardiology, jno new recommendations, except for loop monitor to be placed as outpatient after he is seen by DR [facility]. Neurology consult was requested but patient could not wait any longer and decided to leave hospital with intent to follow up with neurology as outpatient. Discussion by problem: 1. Recurrent syncopal episodes, etiology remains unclear. Rule out narcolepsy/cataplexy, could not have been seen by neurologist in the hospital, recommended to make an appointment as outpatient, likely will need outpatient polysomnography, multiple sleep latency test 2. , Sick sinus syndrome, status post  permanent pacemaker placement, continue metoprolol at 25 twice a day, appreciate cardiology's recommendations , may need loop monitor as outpatient, follow up with dr Juliann Pares. 3. Hypotension, seems to be stable. Unremarkable orthostatic vital signs 4. Thrombocytopenia, stable, follow-up as outpatient  DISCHARGE CONDITIONS:   stable  CONSULTS OBTAINED:  Treatment Team:  Marcina Millard, MD Pauletta Browns, MD  DRUG ALLERGIES:   Allergies  Allergen Reactions  . Cortisone Hives  . Penicillins Hives and Other (See Comments)    Unable to obtain enough information to answer additional questions about this medication.      DISCHARGE MEDICATIONS:   Discharge Medication List as of 10/14/2015  2:34 PM    CONTINUE these medications which have NOT CHANGED   Details  allopurinol (ZYLOPRIM) 100 MG tablet Take 100 mg by mouth daily., Until Discontinued, Historical Med    aspirin 81 MG EC tablet Take 81 mg by mouth daily.  , Until Discontinued, Historical Med    docusate sodium (COLACE) 100 MG capsule Take 100 mg by mouth every evening., Until Discontinued, Historical Med    donepezil (ARICEPT) 5 MG tablet Take 5 mg by mouth at bedtime., Until Discontinued, Historical Med    ferrous sulfate 325 (65 FE) MG tablet Take 325 mg by mouth at bedtime. , Until Discontinued, Historical Med    meclizine (ANTIVERT) 12.5 MG tablet Take 12.5 mg by mouth 3 (three) times daily as needed for dizziness. , Until Discontinued, Historical Med    metoprolol tartrate (LOPRESSOR) 25 MG tablet Take 1 tablet (25 mg total) by mouth 2 (  two) times daily., Starting 10/08/2015, Until Discontinued, Normal    nitroGLYCERIN (NITROSTAT) 0.4 MG SL tablet Place 0.4 mg under the tongue every 5 (five) minutes as needed for chest pain., Until Discontinued, Historical Med    omeprazole (PRILOSEC) 20 MG capsule Take 20 mg by mouth daily., Until Discontinued, Historical Med    Tamsulosin HCl (FLOMAX) 0.4 MG CAPS Take 0.4  mg by mouth at bedtime. , Until Discontinued, Historical Med    vitamin B-12 (CYANOCOBALAMIN) 1000 MCG tablet Take 1,000 mcg by mouth daily., Until Discontinued, Historical Med      STOP taking these medications     potassium chloride (K-DUR) 10 MEQ tablet          DISCHARGE INSTRUCTIONS:    Follow up with PCP, cardiology  If you experience worsening of your admission symptoms, develop shortness of breath, life threatening emergency, suicidal or homicidal thoughts you must seek medical attention immediately by calling 911 or calling your MD immediately  if symptoms less severe.  You Must read complete instructions/literature along with all the possible adverse reactions/side effects for all the Medicines you take and that have been prescribed to you. Take any new Medicines after you have completely understood and accept all the possible adverse reactions/side effects.   Please note  You were cared for by a hospitalist during your hospital stay. If you have any questions about your discharge medications or the care you received while you were in the hospital after you are discharged, you can call the unit and asked to speak with the hospitalist on call if the hospitalist that took care of you is not available. Once you are discharged, your primary care physician will handle any further medical issues. Please note that NO REFILLS for any discharge medications will be authorized once you are discharged, as it is imperative that you return to your primary care physician (or establish a relationship with a primary care physician if you do not have one) for your aftercare needs so that they can reassess your need for medications and monitor your lab values.    Today   CHIEF COMPLAINT:   Chief Complaint  Patient presents with  . Fatigue    s/p pacemaker insertion in past 2 weeks.  . Weakness  . Loss of Consciousness    sunday    HISTORY OF PRESENT ILLNESS:  Gregory [name]  is a 81  y.o. male with a known history of OSA, cardiomyopathy, SSS, s/p pacemaker placement was brought back with recurrent syncope. Heart rate is in 80s to 100s. Pacemaker is functioning well. No syncopal episode in the hospital, telemetry is unremarkable, seen [facility], jno new recommendations, except for loop monitor to be placed as outpatient [facility]. Neurology consult was requested but patient could not wait any longer and decided to leave hospital with intent to follow up with neurology as outpatient. Discussion by problem: 1. Recurrent syncopal episodes, etiology remains unclear. Rule out narcolepsy/cataplexy, could not have been seen by neurologist in the hospital, recommended to make an appointment as outpatient, likely will need outpatient polysomnography, multiple sleep latency test 2. , Sick sinus syndrome, status post permanent pacemaker placement, continue metoprolol at 25 twice a day, appreciate cardiology's recommendations , may need loop monitor as outpatient, follow up with dr [facility]. 3. Hypotension, seems to be stable. Unremarkable orthostatic vital signs 4. Thrombocytopenia, stable, follow-up as outpatient  DISCHARGE CONDITIONS:     VITAL SIGNS:  Blood pressure 97/68, pulse 107,  temperature 97.6 F (36.4 C), temperature source Oral, resp. rate 18, height  (1.626 m), weight 77.111 kg (170 lb), SpO2 94 %.  I/O:  No intake or output data in the 24 hours ending 10/15/15 1647  PHYSICAL EXAMINATION:  GENERAL:  81 y.o.-year-old patient lying in the bed with no acute distress.  EYES: Pupils equal, round, reactive to light and accommodation. No scleral icterus. Extraocular muscles intact.  HEENT: Head atraumatic, normocephalic. Oropharynx and nasopharynx clear.  NECK:  Supple, no jugular venous distention. No thyroid enlargement, no tenderness.  LUNGS: Normal breath sounds bilaterally, no wheezing, rales,rhonchi or crepitation. No use of accessory  muscles of respiration.  CARDIOVASCULAR: S1, S2 normal. No murmurs, rubs, or gallops.  ABDOMEN: Soft, non-tender, non-distended. Bowel sounds present. No organomegaly or mass.  EXTREMITIES: No pedal edema, cyanosis, or clubbing.  NEUROLOGIC: Cranial nerves II through XII are intact. Muscle strength 5/5 in all extremities. Sensation intact. Gait not checked.  PSYCHIATRIC: The patient is alert and oriented x 3.  SKIN: No obvious rash, lesion, or ulcer.   DATA REVIEW:   CBC  Recent Labs Lab 10/12/15 1221  WBC 5.3  HGB 13.7  HCT 43.0  PLT 127*    Chemistries   Recent Labs Lab 10/12/15 1221  NA 140  K 4.0  CL 108  CO2 25  GLUCOSE 135*  BUN 24*  CREATININE 1.15  CALCIUM 9.0  MG 2.1  AST 37  ALT 23  ALKPHOS 93  BILITOT 1.1    Cardiac Enzymes  Recent Labs Lab 10/12/15 1221  TROPONINI <0.03    Microbiology Results  Results for orders placed or performed during the hospital encounter of 10/12/15  Urine culture     Status: None   Collection Time: 10/12/15 11:59 AM  Result Value Ref Range Status   Specimen Description URINE, RANDOM  Final   Special Requests NONE  Final   Culture INSIGNIFICANT GROWTH  Final   Report Status 10/14/2015 FINAL  Final  Culture, blood (routine x 2)     Status: None (Preliminary result)   Collection Time: 10/12/15 12:21 PM  Result Value Ref Range Status   Specimen Description BLOOD RIGHT ASSIST CONTROL  Final   Special Requests   Final    BOTTLES DRAWN AEROBIC AND ANAEROBIC  5CC AERO 14CC ANAERO   Culture NO GROWTH 3 DAYS  Final   Report Status PENDING  Incomplete    RADIOLOGY:  No results found.  EKG:   Orders placed or performed during the hospital encounter of 10/12/15  . EKG 12-Lead  . EKG 12-Lead      Management plans discussed with the patient, family and they are in agreement.  CODE STATUS:   TOTAL TIME TAKING CARE OF THIS PATIENT: 40 minutes.    Katharina Caper M.D on 10/15/2015 at 4:47 PM  Between 7am to 6pm  - Pager - [phone]  After 6pm go to [email or web address] - password [email or web address] [facility]  [facility] [place] [facility]  Office  [phone]  CC: Primary care physician; Leanna Sato, MD

## 2015-10-16 ENCOUNTER — Encounter: Payer: Self-pay | Admitting: Medical Oncology

## 2015-10-16 ENCOUNTER — Emergency Department
Admission: EM | Admit: 2015-10-16 | Discharge: 2015-10-16 | Disposition: A | Discharge status: Home / Self Care | Payer: Medicare PPO | Attending: Emergency Medicine | Admitting: Emergency Medicine

## 2015-10-16 DIAGNOSIS — Z79899 Other long term (current) drug therapy: Secondary | ICD-10-CM | POA: Diagnosis not present

## 2015-10-16 DIAGNOSIS — Z880 Allergy status to penicillin: Secondary | ICD-10-CM | POA: Insufficient documentation

## 2015-10-16 DIAGNOSIS — I10 Essential (primary) hypertension: Secondary | ICD-10-CM | POA: Diagnosis not present

## 2015-10-16 DIAGNOSIS — Z7982 Long term (current) use of aspirin: Secondary | ICD-10-CM | POA: Diagnosis not present

## 2015-10-16 DIAGNOSIS — R110 Nausea: Secondary | ICD-10-CM | POA: Insufficient documentation

## 2015-10-16 DIAGNOSIS — Z87891 Personal history of nicotine dependence: Secondary | ICD-10-CM | POA: Insufficient documentation

## 2015-10-16 LAB — CBC
HCT: 42 % (ref 40.0–52.0)
HEMOGLOBIN: 13.6 g/dL (ref 13.0–18.0)
MCH: 30.4 pg (ref 26.0–34.0)
MCHC: 32.4 g/dL (ref 32.0–36.0)
MCV: 93.7 fL (ref 80.0–100.0)
Platelets: 137 10*3/uL — ABNORMAL LOW (ref 150–440)
RBC: 4.48 MIL/uL (ref 4.40–5.90)
RDW: 15.3 % — ABNORMAL HIGH (ref 11.5–14.5)
WBC: 4.4 10*3/uL (ref 3.8–10.6)

## 2015-10-16 LAB — COMPREHENSIVE METABOLIC PANEL
ALBUMIN: 3.6 g/dL (ref 3.5–5.0)
ALK PHOS: 106 U/L (ref 38–126)
ALT: 19 U/L (ref 17–63)
ANION GAP: 6 (ref 5–15)
AST: 28 U/L (ref 15–41)
BUN: 21 mg/dL — ABNORMAL HIGH (ref 6–20)
CALCIUM: 9.2 mg/dL (ref 8.9–10.3)
CHLORIDE: 110 mmol/L (ref 101–111)
CO2: 26 mmol/L (ref 22–32)
CREATININE: 1.12 mg/dL (ref 0.61–1.24)
GFR calc non Af Amer: 59 mL/min — ABNORMAL LOW (ref >60)
GLUCOSE: 96 mg/dL (ref 65–99)
Potassium: 4.1 mmol/L (ref 3.5–5.1)
SODIUM: 142 mmol/L (ref 135–145)
Total Bilirubin: 1.4 mg/dL — ABNORMAL HIGH (ref 0.3–1.2)
Total Protein: 7 g/dL (ref 6.5–8.1)

## 2015-10-16 LAB — URINALYSIS COMPLETE WITH MICROSCOPIC (ARMC ONLY)
Bacteria, UA: NONE SEEN
Bilirubin Urine: NEGATIVE
Glucose, UA: NEGATIVE mg/dL
Hgb urine dipstick: NEGATIVE
Leukocytes, UA: NEGATIVE
Nitrite: NEGATIVE
PH: 5 (ref 5.0–8.0)
PROTEIN: 100 mg/dL — AB
SQUAMOUS EPITHELIAL / LPF: NONE SEEN
Specific Gravity, Urine: 1.027 (ref 1.005–1.030)

## 2015-10-16 LAB — TROPONIN I

## 2015-10-16 LAB — LIPASE, BLOOD: LIPASE: 55 U/L — AB (ref 11–51)

## 2015-10-16 NOTE — Discharge Instructions (Signed)
Please seek medical attention for any high fevers, chest pain, shortness of breath, change in behavior, persistent vomiting, bloody stool or any other new or concerning symptoms. ° ° °Nausea, Adult °Nausea is the feeling that you have an upset stomach or have to vomit. Nausea by itself is not likely a serious concern, but it may be an early sign of more serious medical problems. As nausea gets worse, it can lead to vomiting. If vomiting develops, there is the risk of dehydration.  °CAUSES  °· Viral infections. °· Food poisoning. °· Medicines. °· Pregnancy. °· Motion sickness. °· Migraine headaches. °· Emotional distress. °· Severe pain from any source. °· Alcohol intoxication. °HOME CARE INSTRUCTIONS °· Get plenty of rest. °· Ask your caregiver about specific rehydration instructions. °· Eat small amounts of food and sip liquids more often. °· Take all medicines as told by your caregiver. °SEEK MEDICAL CARE IF: °· You have not improved after 2 days, or you get worse. °· You have a headache. °SEEK IMMEDIATE MEDICAL CARE IF:  °· You have a fever. °· You faint. °· You keep vomiting or have blood in your vomit. °· You are extremely weak or dehydrated. °· You have dark or bloody stools. °· You have severe chest or abdominal pain. °MAKE SURE YOU: °· Understand these instructions. °· Will watch your condition. °· Will get help right away if you are not doing well or get worse. °  °This information is not intended to replace advice given to you by your health care provider. Make sure you discuss any questions you have with your health care provider. °  °Document Released: 12/14/2004 Document Revised: 11/27/2014 Document Reviewed: 07/19/2011 °Elsevier Interactive Patient Education ©2016 [employer] ° °

## 2015-10-16 NOTE — ED Notes (Signed)
Nurse to room to see if pt can give a urine sample. Nurse ask patient if she can do a in and out catheter for sample pt refuses and states he will drink his coffee then see if he can urinate.

## 2015-10-16 NOTE — ED Provider Notes (Signed)
[facility] Emergency Department Provider Note    ____________________________________________  Time seen: 1040  I have reviewed the triage vital signs and the nursing notes.   HISTORY  Chief Complaint Nausea   History limited by: Dementia   HPI Jolyne LoaWilliam A Grenda is a 81 y.o. male who is brought in by family today because of some concerns for nausea this morning. The patient does have a history of dementia so is not a great historian. According to the family the patient was complaining of some nausea this morning. The patient has been admitted to the hospital recently. They state he did not look bad at the time he is complaining of nausea. Does not appear that he had any true vomiting. Patient does not report any current nausea. He denies any pain. Denies any fevers.  Past Medical History  Diagnosis Date  . Chronic systolic CHF (congestive heart failure) (HCC)     EF of 45% with diastolic dysfunction, grade I  . History of anemia     secondary to multiple AVMs  . Obstructive sleep apnea   . Coronary artery disease     s/p CABG in 2008 and a recent catheterization which showed no new occlusion   . Hypertension   . Hyperlipidemia   . BPH (benign prostatic hyperplasia)     Patient Active Problem List   Diagnosis Date Noted  . Sick sinus syndrome (HCC) 10/14/2015  . History of permanent cardiac pacemaker placement 10/14/2015  . Thrombocytopenia (HCC) 10/14/2015  . Syncope 10/12/2015  . Chronic combined systolic and diastolic CHF (congestive heart failure) (HCC) 10/08/2015  . Tachycardia 10/08/2015  . Atrial fibrillation with RVR (HCC) 10/08/2015  . Hypotension 10/08/2015  . Pancytopenia (HCC) 10/08/2015  . Near syncope 10/05/2015  . Arrhythmia 10/05/2015  . Chronic systolic CHF (congestive heart failure) (HCC) 10/05/2015  . CAD (coronary artery disease) 10/05/2015  . HTN (hypertension) 10/05/2015  . HLD (hyperlipidemia) 10/05/2015  . BPH (benign  prostatic hyperplasia) 10/05/2015    Past Surgical History  Procedure Laterality Date  . Coronary artery bypass graft  2008    x2, negative cath about 8 months ago  . Insert / replace / remove pacemaker    . Refractive surgery    . Hemorrhoid surgery      Current Outpatient Rx  Name  Route  Sig  Dispense  Refill  . allopurinol (ZYLOPRIM) 100 MG tablet   Oral   Take 100 mg by mouth daily.         Marland Kitchen. aspirin 81 MG EC tablet   Oral   Take 81 mg by mouth daily.           Marland Kitchen. docusate sodium (COLACE) 100 MG capsule   Oral   Take 100 mg by mouth every evening.         . donepezil (ARICEPT) 5 MG tablet   Oral   Take 5 mg by mouth at bedtime.         . ferrous sulfate 325 (65 FE) MG tablet   Oral   Take 325 mg by mouth at bedtime.          . meclizine (ANTIVERT) 12.5 MG tablet   Oral   Take 12.5 mg by mouth 3 (three) times daily as needed for dizziness.          . metoprolol tartrate (LOPRESSOR) 25 MG tablet   Oral   Take 1 tablet (25 mg total) by mouth 2 (two) times daily.  60 tablet   6   . nitroGLYCERIN (NITROSTAT) 0.4 MG SL tablet   Sublingual   Place 0.4 mg under the tongue every 5 (five) minutes as needed for chest pain.         Marland Kitchen omeprazole (PRILOSEC) 20 MG capsule   Oral   Take 20 mg by mouth daily.         . Tamsulosin HCl (FLOMAX) 0.4 MG CAPS   Oral   Take 0.4 mg by mouth at bedtime.          . vitamin B-12 (CYANOCOBALAMIN) 1000 MCG tablet   Oral   Take 1,000 mcg by mouth daily.           Allergies Cortisone and Penicillins  Family History  Problem Relation Age of Onset  . Coronary artery disease Sister   . Cancer Mother     gastric  . Lung cancer Brother     Social History Social History  Substance Use Topics  . Smoking status: Former Smoker    Types: Cigarettes    Quit date: 11/20/1982  . Smokeless tobacco: Never Used  . Alcohol Use: No    Review of Systems  Constitutional: Negative for fever. Cardiovascular:  Negative for chest pain. Respiratory: Negative for shortness of breath. Gastrointestinal: Positive for nausea  10-point ROS otherwise negative.  ____________________________________________   PHYSICAL EXAM:  VITAL SIGNS: ED Triage Vitals  Enc Vitals Group     BP 10/16/15 0856 114/63 mmHg     Pulse Rate 10/16/15 0856 62     Resp 10/16/15 0856 18     Temp 10/16/15 0856 97.4 F (36.3 C)     Temp Source 10/16/15 0856 Oral     SpO2 10/16/15 0856 96 %     Weight 10/16/15 0856 170 lb (77.111 kg)   Constitutional: Awake and alert. Well appearing and in no distress. Eyes: Conjunctivae are normal. PERRL. Normal extraocular movements. ENT   Head: Normocephalic and atraumatic.   Nose: No congestion/rhinnorhea.   Mouth/Throat: Mucous membranes are moist.   Neck: No stridor. Hematological/Lymphatic/Immunilogical: No cervical lymphadenopathy. Cardiovascular: Normal rate, regular rhythm.  No murmurs, rubs, or gallops. Respiratory: Normal respiratory effort without tachypnea nor retractions. Breath sounds are clear and equal bilaterally. No wheezes/rales/rhonchi. Gastrointestinal: Soft and nontender. No distention.  Genitourinary: Deferred Musculoskeletal: Normal range of motion in all extremities. No joint effusions.  No lower extremity tenderness nor edema. Neurologic:  Normal speech and language. No gross focal neurologic deficits are appreciated.  Skin:  Skin is warm, dry and intact. No rash noted. Psychiatric: Mood and affect are normal. Speech and behavior are normal. Patient exhibits appropriate insight and judgment.  ____________________________________________    LABS (pertinent positives/negatives)  Labs Reviewed  LIPASE, BLOOD - Abnormal; Notable for the following:    Lipase 55 (*)    All other components within normal limits  COMPREHENSIVE METABOLIC PANEL - Abnormal; Notable for the following:    BUN 21 (*)    Total Bilirubin 1.4 (*)    GFR calc non Af Amer  59 (*)    All other components within normal limits  CBC - Abnormal; Notable for the following:    RDW 15.3 (*)    Platelets 137 (*)    All other components within normal limits  URINALYSIS COMPLETEWITH MICROSCOPIC ([facility] ONLY) - Abnormal; Notable for the following:    Color, Urine AMBER (*)    APPearance CLEAR (*)    Ketones, ur TRACE (*)    Protein, ur  100 (*)    All other components within normal limits  TROPONIN I     ____________________________________________   EKG  None  ____________________________________________    RADIOLOGY  None   ____________________________________________   PROCEDURES  Procedure(s) performed: None  Critical Care performed: No  ____________________________________________   INITIAL IMPRESSION / ASSESSMENT AND PLAN / ED COURSE  Pertinent labs & imaging results that were available during my care of the patient were reviewed by me and considered in my medical decision making (see chart for details).  Patient brought in by family today because of some concerns for nausea earlier today. Patient himself has no complaints on my exam. He appears well. Physical exam is benign. Blood work without any overly concerning findings lipase minimally elevated. Patient nontender in the epigastric region. At this point no concerning findings. Will discharge home.  ____________________________________________   FINAL CLINICAL IMPRESSION(S) / ED DIAGNOSES  Final diagnoses:  Nausea     Phineas Semen, MD 10/16/15 1510

## 2015-10-16 NOTE — ED Notes (Signed)
Pt was admitted to hospital recently and d/c'd Thursday, since then pt reports he has been feeling bad, reports nausea and body aches. Pt poor historian.

## 2015-10-17 LAB — CULTURE, BLOOD (ROUTINE X 2): CULTURE: NO GROWTH

## 2015-10-22 ENCOUNTER — Inpatient Hospital Stay: Payer: Medicare PPO | Admitting: Hematology and Oncology

## 2015-10-29 ENCOUNTER — Inpatient Hospital Stay: Payer: Medicare PPO | Admitting: Hematology and Oncology

## 2015-12-14 ENCOUNTER — Ambulatory Visit: Payer: Self-pay | Admitting: Internal Medicine

## 2015-12-15 ENCOUNTER — Encounter: Payer: Self-pay | Admitting: *Deleted

## 2016-01-11 ENCOUNTER — Ambulatory Visit: Payer: Medicare PPO | Admitting: Internal Medicine

## 2016-01-13 ENCOUNTER — Ambulatory Visit: Payer: Medicare PPO | Admitting: Internal Medicine

## 2016-07-25 ENCOUNTER — Encounter: Payer: Self-pay | Admitting: Emergency Medicine

## 2016-07-25 ENCOUNTER — Emergency Department
Admission: EM | Admit: 2016-07-25 | Discharge: 2016-07-26 | Disposition: A | Discharge status: Home / Self Care | Payer: Medicare Other | Attending: Emergency Medicine | Admitting: Emergency Medicine

## 2016-07-25 ENCOUNTER — Emergency Department: Payer: Medicare Other

## 2016-07-25 DIAGNOSIS — Z951 Presence of aortocoronary bypass graft: Secondary | ICD-10-CM | POA: Diagnosis not present

## 2016-07-25 DIAGNOSIS — I5042 Chronic combined systolic (congestive) and diastolic (congestive) heart failure: Secondary | ICD-10-CM | POA: Diagnosis not present

## 2016-07-25 DIAGNOSIS — Z79899 Other long term (current) drug therapy: Secondary | ICD-10-CM | POA: Insufficient documentation

## 2016-07-25 DIAGNOSIS — I2510 Atherosclerotic heart disease of native coronary artery without angina pectoris: Secondary | ICD-10-CM | POA: Insufficient documentation

## 2016-07-25 DIAGNOSIS — Z87891 Personal history of nicotine dependence: Secondary | ICD-10-CM | POA: Insufficient documentation

## 2016-07-25 DIAGNOSIS — I110 Hypertensive heart disease with heart failure: Secondary | ICD-10-CM | POA: Insufficient documentation

## 2016-07-25 DIAGNOSIS — R0602 Shortness of breath: Secondary | ICD-10-CM | POA: Insufficient documentation

## 2016-07-25 DIAGNOSIS — Z7982 Long term (current) use of aspirin: Secondary | ICD-10-CM | POA: Insufficient documentation

## 2016-07-25 HISTORY — DX: Unspecified dementia, unspecified severity, without behavioral disturbance, psychotic disturbance, mood disturbance, and anxiety: F03.90

## 2016-07-25 LAB — TROPONIN I: Troponin I: <0.03 ng/mL (ref <0.03)

## 2016-07-25 LAB — BASIC METABOLIC PANEL
ANION GAP: 5 (ref 5–15)
BUN: 26 mg/dL — AB (ref 6–20)
CHLORIDE: 107 mmol/L (ref 101–111)
CO2: 25 mmol/L (ref 22–32)
Calcium: 8.9 mg/dL (ref 8.9–10.3)
Creatinine, Ser: 1.2 mg/dL (ref 0.61–1.24)
GFR calc Af Amer: >60 mL/min (ref >60)
GFR, EST NON AFRICAN AMERICAN: 54 mL/min — AB (ref >60)
Glucose, Bld: 98 mg/dL (ref 65–99)
POTASSIUM: 4.4 mmol/L (ref 3.5–5.1)
SODIUM: 137 mmol/L (ref 135–145)

## 2016-07-25 LAB — CBC
HEMATOCRIT: 41.7 % (ref 40.0–52.0)
HEMOGLOBIN: 13.6 g/dL (ref 13.0–18.0)
MCH: 27.6 pg (ref 26.0–34.0)
MCHC: 32.5 g/dL (ref 32.0–36.0)
MCV: 84.8 fL (ref 80.0–100.0)
Platelets: 116 10*3/uL — ABNORMAL LOW (ref 150–440)
RBC: 4.92 MIL/uL (ref 4.40–5.90)
RDW: 17.1 % — ABNORMAL HIGH (ref 11.5–14.5)
WBC: 3.4 10*3/uL — ABNORMAL LOW (ref 3.8–10.6)

## 2016-07-25 LAB — BRAIN NATRIURETIC PEPTIDE: B NATRIURETIC PEPTIDE 5: 435 pg/mL — AB (ref 0.0–100.0)

## 2016-07-25 NOTE — ED Triage Notes (Signed)
Pt presents to ED with son, son states his wife told him pt was not "breathing right..he needed oxygen." Pt does not wear oxygen at home, [place] Pt denies chest pain, dizziness, or weakness. Pt does not appear in any distress at this time, respirations even and unlabored. Son denies pt coughing or having fever.

## 2016-07-25 NOTE — ED Provider Notes (Signed)
[facility] Emergency Department Provider Note  ____________________________________________  Time seen: Approximately 11:08 PM  I have reviewed the triage vital signs and the nursing notes.   HISTORY  Chief Complaint Shortness of Breath  Level 5 caveat:  Portions of the history and physical were unable to be obtained due to dementia   HPI Gregory [name] is a 82 y.o. male history of CAD status post CABG, CHF with EF of 45%, hypertension, hyperlipidemia, shortness of sleep apnea, and dementia who presents for evaluation of shortness of breath. History is gathered from patient's son as patient is unable to provide any history due to his dementia. According to the son, he walked into the house and found patient lying on his bed complaining of shortness of breath. Patient has no recollection of the event. According to the son patient looked like he was shortly to breathe which prompted him to bring patient to the emergency room. He reports that patient is now back to his baseline. Patient denies chest pain or shortness of breath at this time. Patient is not on Lasix or any diuretics as it was affecting his kidney function and also patient became very dehydrated on it. According to the son patient is compliant with all of his medications.    Past Medical History:  Diagnosis Date  . BPH (benign prostatic hyperplasia)   . Chronic systolic CHF (congestive heart failure) (HCC)    EF of 45% with diastolic dysfunction, grade I  . Coronary artery disease    s/p CABG in 2008 and a recent catheterization which showed no new occlusion   . Dementia   . History of anemia    secondary to multiple AVMs  . Hyperlipidemia   . Hypertension   . Obstructive sleep apnea   . Stroke [facility] [address])     Patient Active Problem List   Diagnosis Date Noted  . Sick sinus syndrome (HCC) 10/14/2015  . History of permanent cardiac pacemaker placement 10/14/2015  . Thrombocytopenia (HCC)  10/14/2015  . Syncope 10/12/2015  . Chronic combined systolic and diastolic CHF (congestive heart failure) (HCC) 10/08/2015  . Tachycardia 10/08/2015  . Atrial fibrillation with RVR (HCC) 10/08/2015  . Hypotension 10/08/2015  . Pancytopenia (HCC) 10/08/2015  . Near syncope 10/05/2015  . Arrhythmia 10/05/2015  . Chronic systolic CHF (congestive heart failure) (HCC) 10/05/2015  . CAD (coronary artery disease) 10/05/2015  . HTN (hypertension) 10/05/2015  . HLD (hyperlipidemia) 10/05/2015  . BPH (benign prostatic hyperplasia) 10/05/2015    Past Surgical History:  Procedure Laterality Date  . CORONARY ARTERY BYPASS GRAFT  2008   x2, negative cath about 8 months ago  . HEMORRHOID SURGERY    . INSERT / REPLACE / REMOVE PACEMAKER    . REFRACTIVE SURGERY      Prior to Admission medications   Medication Sig Start Date End Date Taking? Authorizing Provider  allopurinol (ZYLOPRIM) 100 MG tablet Take 100 mg by mouth daily.    Historical Provider, MD  aspirin 81 MG EC tablet Take 81 mg by mouth daily.      Historical Provider, MD  docusate sodium (COLACE) 100 MG capsule Take 100 mg by mouth every evening.    Historical Provider, MD  donepezil (ARICEPT) 5 MG tablet Take 5 mg by mouth at bedtime.    Historical Provider, MD  ferrous sulfate 325 (65 FE) MG tablet Take 325 mg by mouth at bedtime.     Historical Provider, MD  meclizine (ANTIVERT) 12.5 MG tablet Take  12.5 mg by mouth 3 (three) times daily as needed for dizziness.     Historical Provider, MD  metoprolol tartrate (LOPRESSOR) 25 MG tablet Take 1 tablet (25 mg total) by mouth 2 (two) times daily. 10/08/15   Katharina Caper, MD  nitroGLYCERIN (NITROSTAT) 0.4 MG SL tablet Place 0.4 mg under the tongue every 5 (five) minutes as needed for chest pain.    Historical Provider, MD  omeprazole (PRILOSEC) 20 MG capsule Take 20 mg by mouth daily.    Historical Provider, MD  Tamsulosin HCl (FLOMAX) 0.4 MG CAPS Take 0.4 mg by mouth at bedtime.      Historical Provider, MD  vitamin B-12 (CYANOCOBALAMIN) 1000 MCG tablet Take 1,000 mcg by mouth daily.    Historical Provider, MD    Allergies Cortisone and Penicillins  Family History  Problem Relation Age of Onset  . Cancer Mother     gastric  . Coronary artery disease Sister   . Lung cancer Brother     Social History Social History  Substance Use Topics  . Smoking status: Former Smoker    Types: Cigarettes    Quit date: 11/20/1982  . Smokeless tobacco: Never Used  . Alcohol use No    Review of Systems  Constitutional: Negative for fever. Eyes: Negative for visual changes. ENT: Negative for sore throat. Cardiovascular: Negative for chest pain. Respiratory: + shortness of breath. Gastrointestinal: Negative for abdominal pain, vomiting or diarrhea. Genitourinary: Negative for dysuria. Musculoskeletal: Negative for back pain. Skin: Negative for rash. Neurological: Negative for headaches, weakness or numbness.  ____________________________________________   PHYSICAL EXAM:  VITAL SIGNS: ED Triage Vitals  Enc Vitals Group     BP 07/25/16 1913 118/63     Pulse Rate 07/25/16 1913 63     Resp 07/25/16 1913 18     Temp 07/25/16 1913 99 F (37.2 C)     Temp Source 07/25/16 1913 Oral     SpO2 07/25/16 1913 98 %     Weight 07/25/16 1914 180 lb (81.6 kg)     Height 07/25/16 1914 5\' 5"  (1.651 m)     Head Circumference --      Peak Flow --      Pain Score 07/25/16 2235 0     Pain Loc --      Pain Edu? --      Excl. in GC? --     Constitutional: Alert and oriented x 1. Well appearing and in no apparent distress. HEENT:      Head: Normocephalic and atraumatic.         Eyes: Conjunctivae are normal. Sclera is non-icteric. EOMI. PERRL      Mouth/Throat: Mucous membranes are moist.       Neck: Supple with no signs of meningismus. Cardiovascular: Regular rate and rhythm. No murmurs, gallops, or rubs. 2+ symmetrical distal pulses are present in all extremities. No  JVD. Respiratory: Normal respiratory effort. Lungs are clear to auscultation bilaterally. No wheezes, crackles, or rhonchi.  Gastrointestinal: Soft, non tender, and non distended with positive bowel sounds. No rebound or guarding. Genitourinary: No CVA tenderness. Musculoskeletal: Nontender with normal range of motion in all extremities. No edema, cyanosis, or erythema of extremities. Neurologic: Normal speech and language. Face is symmetric. Moving all extremities. No gross focal neurologic deficits are appreciated. Skin: Skin is warm, dry and intact. No rash noted. Psychiatric: Mood and affect are normal. Speech and behavior are normal.  ____________________________________________   LABS (all labs ordered are listed, but only  abnormal results are displayed)  Labs Reviewed  CBC - Abnormal; Notable for the following:       Result Value   WBC 3.4 (*)    RDW 17.1 (*)    Platelets 116 (*)    All other components within normal limits  BASIC METABOLIC PANEL - Abnormal; Notable for the following:    BUN 26 (*)    GFR calc non Af Amer 54 (*)    All other components within normal limits  BRAIN NATRIURETIC PEPTIDE - Abnormal; Notable for the following:    B Natriuretic Peptide 435.0 (*)    All other components within normal limits  TROPONIN I  TROPONIN I   ____________________________________________  EKG  ED ECG REPORT I, Nita Sicklearolina Harlie Ragle, the attending physician, personally viewed and interpreted this ECG. AV dual paced rhythm, rate of 63, prolonged QTC of 505, no ST elevations or depressions. Unchanged from prior. ____________________________________________  RADIOLOGY  CXR: negative  ____________________________________________   PROCEDURES  Procedure(s) performed: None Procedures Critical Care performed:  None ____________________________________________   INITIAL IMPRESSION / ASSESSMENT AND PLAN / ED COURSE  82 y.o. male history of CAD status post CABG, CHF with  EF of 45%, hypertension, hyperlipidemia, shortness of sleep apnea, and dementia who presents for evaluation of short-lived episode shortness of breath that self resolved. Patient is back to baseline and has no complaints at this time. His EKG is nonischemic. Troponin 2 is negative. Lungs are clear, is moving good air with no wheezing. Discussed trace pitting edema and crackles in the right base. Chest x-ray with no evidence of pulmonary edema. BNP elevated at 435 and patient does have trace edema on b/l LE and crackles on the RL base however no evidence of pulmonary edema on CXR, patient's SOB resolved with no intervention, no oxygen requirement.Will ambulate patient and if patient remains 9 asymptomatic with no oxygen requirement we'll defer starting patient on Lasix and have him follow-up with his cardiology in the next 2 days. Patient does have a history of smoking and has a chest x-ray with emphysematous changes however no wheezing and moving great air at this time with no evidence of COPD exacerbation.  Clinical Course  Comment By Time  Patient ambulated without desating and without SOB. Will dc home at this time with close f/u with cardiology and strict return precautions. Nita Sicklearolina Timisha Mondry, MD 09/06 [phone]    Pertinent labs & imaging results that were available during my care of the patient were reviewed by me and considered in my medical decision making (see chart for details).    ____________________________________________   FINAL CLINICAL IMPRESSION(S) / ED DIAGNOSES  Final diagnoses:  SOB (shortness of breath)      NEW MEDICATIONS STARTED DURING THIS VISIT:  Discharge Medication List as of 07/26/2016 12:37 AM       Note:  This document was prepared using Dragon voice recognition software and may include unintentional dictation errors.    Nita Sicklearolina Andreas Sobolewski, MD 07/26/16 [phone]

## 2016-07-26 NOTE — ED Notes (Signed)
Pt discharged by Mertie MooresAmy Coyne, RN

## 2016-11-15 ENCOUNTER — Inpatient Hospital Stay
Admission: EM | Admit: 2016-11-15 | Discharge: 2016-11-17 | DRG: 191 | Disposition: A | Discharge status: Home Health | Payer: Medicare Other | Attending: Internal Medicine | Admitting: Internal Medicine

## 2016-11-15 ENCOUNTER — Emergency Department: Payer: Medicare Other

## 2016-11-15 ENCOUNTER — Encounter: Payer: Self-pay | Admitting: Emergency Medicine

## 2016-11-15 DIAGNOSIS — Z801 Family history of malignant neoplasm of trachea, bronchus and lung: Secondary | ICD-10-CM

## 2016-11-15 DIAGNOSIS — Z79899 Other long term (current) drug therapy: Secondary | ICD-10-CM | POA: Diagnosis not present

## 2016-11-15 DIAGNOSIS — B349 Viral infection, unspecified: Secondary | ICD-10-CM | POA: Diagnosis present

## 2016-11-15 DIAGNOSIS — R2681 Unsteadiness on feet: Secondary | ICD-10-CM

## 2016-11-15 DIAGNOSIS — I110 Hypertensive heart disease with heart failure: Secondary | ICD-10-CM | POA: Diagnosis not present

## 2016-11-15 DIAGNOSIS — Z8249 Family history of ischemic heart disease and other diseases of the circulatory system: Secondary | ICD-10-CM

## 2016-11-15 DIAGNOSIS — R0603 Acute respiratory distress: Secondary | ICD-10-CM

## 2016-11-15 DIAGNOSIS — J441 Chronic obstructive pulmonary disease with (acute) exacerbation: Secondary | ICD-10-CM | POA: Diagnosis not present

## 2016-11-15 DIAGNOSIS — I5042 Chronic combined systolic (congestive) and diastolic (congestive) heart failure: Secondary | ICD-10-CM | POA: Diagnosis not present

## 2016-11-15 DIAGNOSIS — Z87891 Personal history of nicotine dependence: Secondary | ICD-10-CM | POA: Diagnosis not present

## 2016-11-15 DIAGNOSIS — E785 Hyperlipidemia, unspecified: Secondary | ICD-10-CM | POA: Diagnosis not present

## 2016-11-15 DIAGNOSIS — D61818 Other pancytopenia: Secondary | ICD-10-CM | POA: Diagnosis not present

## 2016-11-15 DIAGNOSIS — Z951 Presence of aortocoronary bypass graft: Secondary | ICD-10-CM | POA: Diagnosis not present

## 2016-11-15 DIAGNOSIS — I2510 Atherosclerotic heart disease of native coronary artery without angina pectoris: Secondary | ICD-10-CM | POA: Diagnosis present

## 2016-11-15 DIAGNOSIS — F0390 Unspecified dementia without behavioral disturbance: Secondary | ICD-10-CM | POA: Diagnosis not present

## 2016-11-15 DIAGNOSIS — Z8673 Personal history of transient ischemic attack (TIA), and cerebral infarction without residual deficits: Secondary | ICD-10-CM | POA: Diagnosis not present

## 2016-11-15 DIAGNOSIS — Z950 Presence of cardiac pacemaker: Secondary | ICD-10-CM | POA: Diagnosis not present

## 2016-11-15 DIAGNOSIS — I10 Essential (primary) hypertension: Secondary | ICD-10-CM | POA: Diagnosis present

## 2016-11-15 DIAGNOSIS — Z515 Encounter for palliative care: Secondary | ICD-10-CM

## 2016-11-15 DIAGNOSIS — R0602 Shortness of breath: Secondary | ICD-10-CM | POA: Diagnosis present

## 2016-11-15 DIAGNOSIS — Z7189 Other specified counseling: Secondary | ICD-10-CM

## 2016-11-15 DIAGNOSIS — G4733 Obstructive sleep apnea (adult) (pediatric): Secondary | ICD-10-CM | POA: Diagnosis present

## 2016-11-15 DIAGNOSIS — Z66 Do not resuscitate: Secondary | ICD-10-CM | POA: Diagnosis present

## 2016-11-15 DIAGNOSIS — R531 Weakness: Secondary | ICD-10-CM

## 2016-11-15 DIAGNOSIS — D72819 Decreased white blood cell count, unspecified: Secondary | ICD-10-CM

## 2016-11-15 DIAGNOSIS — R410 Disorientation, unspecified: Secondary | ICD-10-CM

## 2016-11-15 DIAGNOSIS — I495 Sick sinus syndrome: Secondary | ICD-10-CM | POA: Diagnosis present

## 2016-11-15 LAB — BASIC METABOLIC PANEL
ANION GAP: 6 (ref 5–15)
BUN: 27 mg/dL — ABNORMAL HIGH (ref 6–20)
CALCIUM: 8.3 mg/dL — AB (ref 8.9–10.3)
CO2: 25 mmol/L (ref 22–32)
CREATININE: 1.4 mg/dL — AB (ref 0.61–1.24)
Chloride: 107 mmol/L (ref 101–111)
GFR, EST AFRICAN AMERICAN: 52 mL/min — AB (ref >60)
GFR, EST NON AFRICAN AMERICAN: 45 mL/min — AB (ref >60)
Glucose, Bld: 93 mg/dL (ref 65–99)
Potassium: 3.8 mmol/L (ref 3.5–5.1)
SODIUM: 138 mmol/L (ref 135–145)

## 2016-11-15 LAB — CBC
HCT: 36 % — ABNORMAL LOW (ref 40.0–52.0)
HEMOGLOBIN: 11.6 g/dL — AB (ref 13.0–18.0)
MCH: 29 pg (ref 26.0–34.0)
MCHC: 32.1 g/dL (ref 32.0–36.0)
MCV: 90.2 fL (ref 80.0–100.0)
PLATELETS: 99 10*3/uL — AB (ref 150–440)
RBC: 3.99 MIL/uL — AB (ref 4.40–5.90)
RDW: 16.3 % — ABNORMAL HIGH (ref 11.5–14.5)
WBC: 2.2 10*3/uL — ABNORMAL LOW (ref 3.8–10.6)

## 2016-11-15 LAB — HEPATIC FUNCTION PANEL
ALBUMIN: 2.6 g/dL — AB (ref 3.5–5.0)
ALT: 15 U/L — ABNORMAL LOW (ref 17–63)
AST: 39 U/L (ref 15–41)
Alkaline Phosphatase: 146 U/L — ABNORMAL HIGH (ref 38–126)
BILIRUBIN DIRECT: 0.5 mg/dL (ref 0.1–0.5)
BILIRUBIN TOTAL: 1.2 mg/dL (ref 0.3–1.2)
Indirect Bilirubin: 0.7 mg/dL (ref 0.3–0.9)
Total Protein: 6.9 g/dL (ref 6.5–8.1)

## 2016-11-15 LAB — TROPONIN I

## 2016-11-15 LAB — LIPASE, BLOOD: LIPASE: 81 U/L — AB (ref 11–51)

## 2016-11-15 LAB — BRAIN NATRIURETIC PEPTIDE: B NATRIURETIC PEPTIDE 5: 325 pg/mL — AB (ref 0.0–100.0)

## 2016-11-15 MED ORDER — RAMIPRIL 2.5 MG PO CAPS
2.5000 mg | ORAL_CAPSULE | Freq: Two times a day (BID) | ORAL | Status: DC
  Administered 2016-11-15 – 2016-11-16 (×2): 2.5 mg via ORAL
  Filled 2016-11-15 (×5): qty 1

## 2016-11-15 MED ORDER — METHYLPREDNISOLONE SODIUM SUCC 125 MG IJ SOLR
60.0000 mg | Freq: Once | INTRAMUSCULAR | Status: AC
  Administered 2016-11-15: 60 mg via INTRAVENOUS
  Filled 2016-11-15: qty 2

## 2016-11-15 MED ORDER — DONEPEZIL HCL 5 MG PO TABS
10.0000 mg | ORAL_TABLET | Freq: Every day | ORAL | Status: DC
  Administered 2016-11-15 – 2016-11-16 (×2): 10 mg via ORAL
  Filled 2016-11-15 (×3): qty 2

## 2016-11-15 MED ORDER — ACETAMINOPHEN 325 MG PO TABS: 650.0000 mg | ORAL_TABLET | Freq: Four times a day (QID) | ORAL | Status: DC | PRN

## 2016-11-15 MED ORDER — CEFTRIAXONE SODIUM-DEXTROSE 1-3.74 GM-% IV SOLR
1.0000 g | INTRAVENOUS | Status: AC
  Administered 2016-11-15: 1 g via INTRAVENOUS

## 2016-11-15 MED ORDER — METHYLPREDNISOLONE SODIUM SUCC 125 MG IJ SOLR
60.0000 mg | Freq: Four times a day (QID) | INTRAMUSCULAR | Status: DC
  Administered 2016-11-16 – 2016-11-17 (×7): 60 mg via INTRAVENOUS
  Filled 2016-11-15 (×7): qty 2

## 2016-11-15 MED ORDER — ONDANSETRON HCL 4 MG/2ML IJ SOLN: 4.0000 mg | Freq: Four times a day (QID) | INTRAMUSCULAR | Status: DC | PRN

## 2016-11-15 MED ORDER — FUROSEMIDE 20 MG PO TABS
20.0000 mg | ORAL_TABLET | Freq: Every day | ORAL | Status: DC
  Administered 2016-11-16 – 2016-11-17 (×2): 20 mg via ORAL
  Filled 2016-11-15 (×2): qty 1

## 2016-11-15 MED ORDER — CEFTRIAXONE SODIUM-DEXTROSE 1-3.74 GM-% IV SOLR
INTRAVENOUS | Status: AC
  Administered 2016-11-15: 1 g via INTRAVENOUS
  Filled 2016-11-15: qty 50

## 2016-11-15 MED ORDER — ONDANSETRON HCL 4 MG PO TABS: 4.0000 mg | ORAL_TABLET | Freq: Four times a day (QID) | ORAL | Status: DC | PRN

## 2016-11-15 MED ORDER — ENOXAPARIN SODIUM 40 MG/0.4ML ~~LOC~~ SOLN
40.0000 mg | SUBCUTANEOUS | Status: DC
  Administered 2016-11-16: 40 mg via SUBCUTANEOUS
  Filled 2016-11-15: qty 0.4

## 2016-11-15 MED ORDER — TAMSULOSIN HCL 0.4 MG PO CAPS
0.4000 mg | ORAL_CAPSULE | Freq: Every day | ORAL | Status: DC
  Administered 2016-11-15 – 2016-11-16 (×2): 0.4 mg via ORAL
  Filled 2016-11-15 (×2): qty 1

## 2016-11-15 MED ORDER — IPRATROPIUM-ALBUTEROL 0.5-2.5 (3) MG/3ML IN SOLN
3.0000 mL | Freq: Once | RESPIRATORY_TRACT | Status: AC
  Administered 2016-11-15: 3 mL via RESPIRATORY_TRACT
  Filled 2016-11-15: qty 3

## 2016-11-15 MED ORDER — AMIODARONE HCL 200 MG PO TABS
200.0000 mg | ORAL_TABLET | Freq: Every day | ORAL | Status: DC
  Administered 2016-11-16 – 2016-11-17 (×2): 200 mg via ORAL
  Filled 2016-11-15 (×2): qty 1

## 2016-11-15 MED ORDER — PANTOPRAZOLE SODIUM 40 MG PO TBEC
40.0000 mg | DELAYED_RELEASE_TABLET | Freq: Every day | ORAL | Status: DC
  Administered 2016-11-16 – 2016-11-17 (×2): 40 mg via ORAL
  Filled 2016-11-15 (×2): qty 1

## 2016-11-15 MED ORDER — IPRATROPIUM-ALBUTEROL 0.5-2.5 (3) MG/3ML IN SOLN: 3.0000 mL | RESPIRATORY_TRACT | Status: DC | PRN

## 2016-11-15 MED ORDER — DEXTROSE 5 % IV SOLN
500.0000 mg | INTRAVENOUS | Status: DC
  Filled 2016-11-15: qty 500

## 2016-11-15 MED ORDER — ACETAMINOPHEN 650 MG RE SUPP: 650.0000 mg | Freq: Four times a day (QID) | RECTAL | Status: DC | PRN

## 2016-11-15 MED ORDER — DEXTROSE 5 % IV SOLN
500.0000 mg | Freq: Once | INTRAVENOUS | Status: AC
  Administered 2016-11-15: 500 mg via INTRAVENOUS
  Filled 2016-11-15: qty 500

## 2016-11-15 MED ORDER — DEXTROSE 5 % IV SOLN: 1.0000 g | INTRAVENOUS | Status: DC

## 2016-11-15 MED ORDER — ALLOPURINOL 100 MG PO TABS
100.0000 mg | ORAL_TABLET | Freq: Every day | ORAL | Status: DC
  Administered 2016-11-16 – 2016-11-17 (×2): 100 mg via ORAL
  Filled 2016-11-15 (×2): qty 1

## 2016-11-15 MED ORDER — METOPROLOL TARTRATE 25 MG PO TABS
25.0000 mg | ORAL_TABLET | Freq: Two times a day (BID) | ORAL | Status: DC
  Administered 2016-11-15 – 2016-11-16 (×2): 25 mg via ORAL
  Filled 2016-11-15 (×3): qty 1

## 2016-11-15 MED ORDER — CEFTRIAXONE SODIUM-DEXTROSE 1-3.74 GM-% IV SOLR
1.0000 g | INTRAVENOUS | Status: DC
  Administered 2016-11-16: 1 g via INTRAVENOUS
  Filled 2016-11-15 (×2): qty 50

## 2016-11-15 MED ORDER — SODIUM CHLORIDE 0.9% FLUSH
3.0000 mL | Freq: Two times a day (BID) | INTRAVENOUS | Status: DC
  Administered 2016-11-15 – 2016-11-17 (×4): 3 mL via INTRAVENOUS

## 2016-11-15 NOTE — H&P (Signed)
[facility] [place] [facility]   PATIENT NAME: Gregory [name]    MR#:  [id number]  DATE OF BIRTH:  03/06/1931  DATE OF ADMISSION:  11/15/2016  PRIMARY CARE PHYSICIAN: Leanna SatoMILES,LINDA M, MD   REQUESTING/REFERRING PHYSICIAN: York CeriseForbach, MD  CHIEF COMPLAINT:   Chief Complaint  Patient presents with  . Shortness of Breath  . Cough    HISTORY OF PRESENT ILLNESS:  Gregory AllegraWilliam Chuba  is a 82 y.o. male who presents with Several days of increased work of breathing and audible wheezing. Patient is unable to give much history due to dementia, family members present at bedside and provides most of the history for history of present illness. Family members states that he has had significant increased sputum as well. He has no diagnosis of COPD, but he does have a long history of smoking. Initial lab workup here as well as imaging is also consistent with likely COPD exacerbation. Treatment for the same was started, and hospitalists were called for admission  PAST MEDICAL HISTORY:   Past Medical History:  Diagnosis Date  . BPH (benign prostatic hyperplasia)   . Chronic systolic CHF (congestive heart failure) (HCC)    EF of 45% with diastolic dysfunction, grade I  . Coronary artery disease    s/p CABG in 2008 and a recent catheterization which showed no new occlusion   . Dementia   . History of anemia    secondary to multiple AVMs  . Hyperlipidemia   . Hypertension   . Obstructive sleep apnea   . Stroke [facility](HCC)     PAST SURGICAL HISTORY:   Past Surgical History:  Procedure Laterality Date  . CORONARY ARTERY BYPASS GRAFT  2008   x2, negative cath about 8 months ago  . HEMORRHOID SURGERY    . INSERT / REPLACE / REMOVE PACEMAKER    . REFRACTIVE SURGERY      SOCIAL HISTORY:   Social History  Substance Use Topics  . Smoking status: Former Smoker    Types: Cigarettes    Quit date: 11/20/1982  . Smokeless tobacco: Never Used  . Alcohol use No    FAMILY  HISTORY:   Family History  Problem Relation Age of Onset  . Cancer Mother     gastric  . Coronary artery disease Sister   . Lung cancer Brother     DRUG ALLERGIES:   Allergies  Allergen Reactions  . Cortisone Hives  . Penicillins Hives and Other (See Comments)    Unable to obtain enough information to answer additional questions about this medication.      MEDICATIONS AT HOME:   Prior to Admission medications   Medication Sig Start Date End Date Taking? Authorizing Provider  allopurinol (ZYLOPRIM) 100 MG tablet Take 100 mg by mouth daily.   Yes Historical Provider, MD  amiodarone (PACERONE) 200 MG tablet Take 200 mg by mouth daily.   Yes Historical Provider, MD  donepezil (ARICEPT) 10 MG tablet Take 10 mg by mouth at bedtime.    Yes Historical Provider, MD  furosemide (LASIX) 20 MG tablet Take 20 mg by mouth.   Yes Historical Provider, MD  metoprolol tartrate (LOPRESSOR) 25 MG tablet Take 1 tablet (25 mg total) by mouth 2 (two) times daily. 10/08/15  Yes Katharina Caperima Vaickute, MD  omeprazole (PRILOSEC) 20 MG capsule Take 20 mg by mouth daily.   Yes Historical Provider, MD  potassium chloride (K-DUR,KLOR-CON) 10 MEQ tablet Take 10 mEq by mouth daily.   Yes Historical Provider,  MD  ramipril (ALTACE) 2.5 MG capsule Take 2.5 mg by mouth 2 (two) times daily.   Yes Historical Provider, MD  Tamsulosin HCl (FLOMAX) 0.4 MG CAPS Take 0.4 mg by mouth at bedtime.    Yes Historical Provider, MD    REVIEW OF SYSTEMS:  Review of Systems  Unable to perform ROS: Dementia     VITAL SIGNS:   Vitals:   11/15/16 2000 11/15/16 2030 11/15/16 2100 11/15/16 2130  BP: 99/76 (!) 100/56 (!) 105/46 (!) 109/51  Pulse: 87 86 60 87  Resp: (!) 22 (!) 25 (!) 23 (!) 21  Temp:      TempSrc:      SpO2: 92% 99% 94% 95%  Weight:       Wt Readings from Last 3 Encounters:  11/15/16 81.6 kg (180 lb)  07/25/16 81.6 kg (180 lb)  10/16/15 77.1 kg (170 lb)    PHYSICAL EXAMINATION:  Physical Exam  Vitals  reviewed. Constitutional: He appears well-developed and well-nourished. No distress.  HENT:  Head: Normocephalic and atraumatic.  Mouth/Throat: Oropharynx is clear and moist.  Eyes: Conjunctivae and EOM are normal. Pupils are equal, round, and reactive to light. No scleral icterus.  Neck: Normal range of motion. Neck supple. No JVD present. No thyromegaly present.  Cardiovascular: Normal rate, regular rhythm and intact distal pulses.  Exam reveals no gallop and no friction rub.   No murmur heard. Respiratory: He is in respiratory distress (mild). He has wheezes (diffuse). He has no rales.  GI: Soft. Bowel sounds are normal. He exhibits no distension. There is no tenderness.  Musculoskeletal: Normal range of motion. He exhibits no edema.  No arthritis, no gout  Lymphadenopathy:    He has no cervical adenopathy.  Neurological: He is alert. No cranial nerve deficit.  No dysarthria, no aphasia  Skin: Skin is warm and dry. No rash noted. No erythema.  Psychiatric:  Unable to assess due to patient dementia    LABORATORY PANEL:   CBC  Recent Labs Lab 11/15/16 1736  WBC 2.2*  HGB 11.6*  HCT 36.0*  PLT 99*   ------------------------------------------------------------------------------------------------------------------  Chemistries   Recent Labs Lab 11/15/16 1736  NA 138  K 3.8  CL 107  CO2 25  GLUCOSE 93  BUN 27*  CREATININE 1.40*  CALCIUM 8.3*  AST 39  ALT 15*  ALKPHOS 146*  BILITOT 1.2   ------------------------------------------------------------------------------------------------------------------  Cardiac Enzymes  Recent Labs Lab 11/15/16 1736  TROPONINI <0.03   ------------------------------------------------------------------------------------------------------------------  RADIOLOGY:  Dg Chest 2 View  Result Date: 11/15/2016 CLINICAL DATA:  Shortness of breath, cough, congestion EXAM: CHEST  2 VIEW COMPARISON:  07/25/2016 FINDINGS: Prior CABG.  Left pacer remains in place, unchanged. Chronic density noted at the left base/ lingula, likely scarring. No confluent opacity on the right. No effusions or acute bony abnormality. IMPRESSION: Probable scarring in the left base/lingula. Cardiomegaly. No definite acute process. Electronically Signed   By: Charlett Nose M.D.   On: 11/15/2016 18:26    EKG:   Orders placed or performed during the hospital encounter of 11/15/16  . ED EKG within 10 minutes  . ED EKG within 10 minutes  . EKG 12-Lead  . EKG 12-Lead    IMPRESSION AND PLAN:  Principal Problem:   COPD exacerbation (HCC) - IV steroids and antibiotics with when necessary DuoNeb's. Active Problems:   Chronic combined systolic and diastolic CHF (congestive heart failure) (HCC) - continue home meds, does not seem to be an exacerbation, repeat echocardiogram this  was somehow on file this from a year ago.   CAD (coronary artery disease) - continue home meds   HTN (hypertension) - stable, continue home meds   HLD (hyperlipidemia) - continue home antilipid   Sick sinus syndrome (HCC) - pacemaker in place and functioning within normal limits  All the records are reviewed and case discussed with ED provider. Management plans discussed with the patient and/or family.  DVT PROPHYLAXIS: SubQ lovenox  GI PROPHYLAXIS: PPI  ADMISSION STATUS: Inpatient  CODE STATUS: Full Code Status History    Date Active Date Inactive Code Status Order ID Comments User Context   10/12/2015  5:58 PM 10/14/2015  5:56 PM Full Code [id number]  Enedina FinnerSona Patel, MD Inpatient   10/06/2015 12:09 AM 10/08/2015  3:10 PM Full Code [id number]  Oralia Manisavid Myliah Medel, MD ED      TOTAL TIME TAKING CARE OF THIS PATIENT: 45 minutes.    Kimberly Nieland FIELDING 11/15/2016, 10:05 PM  [facility]  Office  [phone]  CC: Primary care physician; Leanna SatoMILES,LINDA M, MD

## 2016-11-15 NOTE — ED Notes (Signed)
Pt's son brian, [phone], request call for updates

## 2016-11-15 NOTE — ED Triage Notes (Signed)
Pt to ed with c/o sob, cough, congestion since Christmas.  Pt with audible wheezing, labored respirations, unable to speak in full complete sentences.

## 2016-11-15 NOTE — ED Provider Notes (Signed)
[facility] Emergency Department Provider Note  ____________________________________________   First MD Initiated Contact with Patient 11/15/16 1848     (approximate)  I have reviewed the triage vital signs and the nursing notes.   HISTORY  Chief Complaint Shortness of Breath and Cough  Level V caveat: The patient's history and review of systems are limited by his dementia and unwillingness to participate in the H&P.  HPI [name] is a 82 y.o. male with a medical history as listed below but is notable for dementia as well as CHF who presents for evaluation of shortness of breath, cough,and generalized weakness.  History is provided primarily by his daughter-in-law who is present with him in the ED but does not have all the details about his background and recent history.  Reportedly he has had a gradual decline in his functional status over the last few months.  She states it seems like he simply has given up.  He is not complaining of anything in particular although some audible wheezing can be appreciated.  For me he is speaking in full sentences although they are relatively short and curt.  He denies chest pain, abdominal pain, nausea, vomiting.  He does admit to some shortness of breath.  He denies any fever or chills and dysuria.   Past Medical History:  Diagnosis Date  . BPH (benign prostatic hyperplasia)   . Chronic systolic CHF (congestive heart failure) (HCC)    EF of 45% with diastolic dysfunction, grade I  . Coronary artery disease    s/p CABG in 2008 and a recent catheterization which showed no new occlusion   . Dementia   . History of anemia    secondary to multiple AVMs  . Hyperlipidemia   . Hypertension   . Obstructive sleep apnea   . Stroke [facility])     Patient Active Problem List   Diagnosis Date Noted  . COPD exacerbation (HCC) 11/15/2016  . Sick sinus syndrome (HCC) 10/14/2015  . History of permanent cardiac pacemaker  placement 10/14/2015  . Thrombocytopenia (HCC) 10/14/2015  . Syncope 10/12/2015  . Chronic combined systolic and diastolic CHF (congestive heart failure) (HCC) 10/08/2015  . Tachycardia 10/08/2015  . Atrial fibrillation with RVR (HCC) 10/08/2015  . Hypotension 10/08/2015  . Pancytopenia (HCC) 10/08/2015  . Near syncope 10/05/2015  . Arrhythmia 10/05/2015  . CAD (coronary artery disease) 10/05/2015  . HTN (hypertension) 10/05/2015  . HLD (hyperlipidemia) 10/05/2015  . BPH (benign prostatic hyperplasia) 10/05/2015    Past Surgical History:  Procedure Laterality Date  . CORONARY ARTERY BYPASS GRAFT  2008   x2, negative cath about 8 months ago  . HEMORRHOID SURGERY    . INSERT / REPLACE / REMOVE PACEMAKER    . REFRACTIVE SURGERY      Prior to Admission medications   Medication Sig Start Date End Date Taking? Authorizing Provider  allopurinol (ZYLOPRIM) 100 MG tablet Take 100 mg by mouth daily.   Yes Historical Provider, MD  amiodarone (PACERONE) 200 MG tablet Take 200 mg by mouth daily.   Yes Historical Provider, MD  donepezil (ARICEPT) 10 MG tablet Take 10 mg by mouth at bedtime.    Yes Historical Provider, MD  furosemide (LASIX) 20 MG tablet Take 20 mg by mouth.   Yes Historical Provider, MD  metoprolol tartrate (LOPRESSOR) 25 MG tablet Take 1 tablet (25 mg total) by mouth 2 (two) times daily. 10/08/15  Yes Katharina Caper, MD  omeprazole (PRILOSEC) 20 MG capsule Take  20 mg by mouth daily.   Yes Historical Provider, MD  potassium chloride (K-DUR,KLOR-CON) 10 MEQ tablet Take 10 mEq by mouth daily.   Yes Historical Provider, MD  ramipril (ALTACE) 2.5 MG capsule Take 2.5 mg by mouth 2 (two) times daily.   Yes Historical Provider, MD  Tamsulosin HCl (FLOMAX) 0.4 MG CAPS Take 0.4 mg by mouth at bedtime.    Yes Historical Provider, MD    Allergies Cortisone and Penicillins  Family History  Problem Relation Age of Onset  . Cancer Mother     gastric  . Coronary artery disease Sister    . Lung cancer Brother     Social History Social History  Substance Use Topics  . Smoking status: Former Smoker    Types: Cigarettes    Quit date: 11/20/1982  . Smokeless tobacco: Never Used  . Alcohol use No    Review of Systems Constitutional: No fever/chills Eyes: No visual changes. ENT: No sore throat. Cardiovascular: Denies chest pain. Respiratory: +shortness of breath, +cough Gastrointestinal: No abdominal pain.  No nausea, no vomiting.  No diarrhea.  No constipation. Genitourinary: Negative for dysuria. Musculoskeletal: Negative for back pain. Skin: Negative for rash. Neurological: Negative for headaches, focal weakness or numbness.  10-point ROS otherwise negative.  ____________________________________________   PHYSICAL EXAM:  VITAL SIGNS: ED Triage Vitals [11/15/16 1730]  Enc Vitals Group     BP (!) 124/47     Pulse Rate 80     Resp 20     Temp 98.2 F (36.8 C)     Temp Source Oral     SpO2 95 %     Weight 180 lb (81.6 kg)     Height      Head Circumference      Peak Flow      Pain Score 0     Pain Loc      Pain Edu?      Excl. in GC?     Constitutional: Alert and oriented. Appears elderly with associated chronic illness but is in no acute distress, lying flat on his left side at this time. Eyes: Conjunctivae are normal. PERRL. EOMI. Head: Atraumatic. Nose: No congestion/rhinnorhea. Mouth/Throat: Mucous membranes are moist.  Oropharynx non-erythematous. Neck: No stridor.  No meningeal signs.   Cardiovascular: Normal rate, regular rhythm. Good peripheral circulation. Grossly normal heart sounds. Respiratory: Normal respiratory effort.  Expiratory wheezing throughout lung fields with some occasional crackles. Gastrointestinal: Soft and nontender. No distention.  Musculoskeletal: No lower extremity tenderness nor edema. No gross deformities of extremities. Neurologic:  Normal speech and language. No gross focal neurologic deficits are appreciated.    Skin:  Skin is warm, dry and intact. No rash noted.   ____________________________________________   LABS (all labs ordered are listed, but only abnormal results are displayed)  Labs Reviewed  BASIC METABOLIC PANEL - Abnormal; Notable for the following:       Result Value   BUN 27 (*)    Creatinine, Ser 1.40 (*)    Calcium 8.3 (*)    GFR calc non Af Amer 45 (*)    GFR calc Af Amer 52 (*)    All other components within normal limits  CBC - Abnormal; Notable for the following:    WBC 2.2 (*)    RBC 3.99 (*)    Hemoglobin 11.6 (*)    HCT 36.0 (*)    RDW 16.3 (*)    Platelets 99 (*)    All other components within normal  limits  BRAIN NATRIURETIC PEPTIDE - Abnormal; Notable for the following:    B Natriuretic Peptide 325.0 (*)    All other components within normal limits  HEPATIC FUNCTION PANEL - Abnormal; Notable for the following:    Albumin 2.6 (*)    ALT 15 (*)    Alkaline Phosphatase 146 (*)    All other components within normal limits  LIPASE, BLOOD - Abnormal; Notable for the following:    Lipase 81 (*)    All other components within normal limits  CULTURE, BLOOD (ROUTINE X 2)  CULTURE, BLOOD (ROUTINE X 2)  TROPONIN I  BASIC METABOLIC PANEL  CBC   ____________________________________________  EKG  ED ECG REPORT I, Tyreke Kaeser, the attending physician, personally viewed and interpreted this ECG.  Date: 11/15/2016 EKG Time: 17:36 Rate: 81 Rhythm: Ventricular paced rhythm with occasional PVC QRS Axis: normal Intervals: normal ST/T Wave abnormalities: normal Conduction Disturbances: none Narrative Interpretation: No evidence of acute ischemia  ____________________________________________  RADIOLOGY   Dg Chest 2 View  Result Date: 11/15/2016 CLINICAL DATA:  Shortness of breath, cough, congestion EXAM: CHEST  2 VIEW COMPARISON:  07/25/2016 FINDINGS: Prior CABG. Left pacer remains in place, unchanged. Chronic density noted at the left base/ lingula,  likely scarring. No confluent opacity on the right. No effusions or acute bony abnormality. IMPRESSION: Probable scarring in the left base/lingula. Cardiomegaly. No definite acute process. Electronically Signed   By: Charlett Nose M.D.   On: 11/15/2016 18:26    ____________________________________________   PROCEDURES  Procedure(s) performed:   Procedures   Critical Care performed: No ____________________________________________   INITIAL IMPRESSION / ASSESSMENT AND PLAN / ED COURSE  Pertinent labs & imaging results that were available during my care of the patient were reviewed by me and considered in my medical decision making (see chart for details).  Initially I am not finding any evidence of acute or emergent medical condition which would require admission.  He does have some wheezing and crackles but he has no diagnosis of COPD although that is what it sounds most like.  I will give him a DuoNeb and add on a BNP to his labs.  Anticipate that he will be appropriate for outpatient follow-up although I will reassess after all of his lab work is back.   Clinical Course as of Nov 17 15  Wed Nov 15, 2016  1958 Patient having increased work of breathing, now speaking in only a few words at a time.  States he doesn't have enough breath to use the nebulizer.  Persistent wheezing and crackles throughout.  BNP unremarkable.  Given leukopenia, scarring in lung, and respiratory distress, will treat for COPD (although he does not have a diagnosis) and CAP and admit.  [CF]    Clinical Course User Index [CF] Loleta Rose, MD    ____________________________________________  FINAL CLINICAL IMPRESSION(S) / ED DIAGNOSES  Final diagnoses:  Acute respiratory distress  Leukopenia, unspecified type  Confusion  Weakness     MEDICATIONS GIVEN DURING THIS VISIT:  Medications  ipratropium-albuterol (DUONEB) 0.5-2.5 (3) MG/3ML nebulizer solution 3 mL (not administered)  methylPREDNISolone  sodium succinate (SOLU-MEDROL) 125 mg/2 mL injection 60 mg (not administered)  azithromycin (ZITHROMAX) 500 mg in dextrose 5 % 250 mL IVPB (not administered)  amiodarone (PACERONE) tablet 200 mg (not administered)  furosemide (LASIX) tablet 20 mg (not administered)  ramipril (ALTACE) capsule 2.5 mg (2.5 mg Oral Given 11/15/16 2304)  metoprolol tartrate (LOPRESSOR) tablet 25 mg (25 mg Oral Given 11/15/16 2304)  allopurinol (ZYLOPRIM) tablet 100 mg (not administered)  donepezil (ARICEPT) tablet 10 mg (10 mg Oral Given 11/15/16 2304)  pantoprazole (PROTONIX) EC tablet 40 mg (not administered)  tamsulosin (FLOMAX) capsule 0.4 mg (0.4 mg Oral Given 11/15/16 2304)  enoxaparin (LOVENOX) injection 40 mg (not administered)  sodium chloride flush (NS) 0.9 % injection 3 mL (3 mLs Intravenous Given 11/15/16 2304)  acetaminophen (TYLENOL) tablet 650 mg (not administered)    Or  acetaminophen (TYLENOL) suppository 650 mg (not administered)  ondansetron (ZOFRAN) tablet 4 mg (not administered)    Or  ondansetron (ZOFRAN) injection 4 mg (not administered)  cefTRIAXone (ROCEPHIN) IVPB 1 g (not administered)  ipratropium-albuterol (DUONEB) 0.5-2.5 (3) MG/3ML nebulizer solution 3 mL (3 mLs Nebulization Given 11/15/16 1906)  ipratropium-albuterol (DUONEB) 0.5-2.5 (3) MG/3ML nebulizer solution 3 mL (3 mLs Nebulization Given 11/15/16 2016)  ipratropium-albuterol (DUONEB) 0.5-2.5 (3) MG/3ML nebulizer solution 3 mL (3 mLs Nebulization Given 11/15/16 2016)  methylPREDNISolone sodium succinate (SOLU-MEDROL) 125 mg/2 mL injection 60 mg (60 mg Intravenous Given 11/15/16 2014)  azithromycin (ZITHROMAX) 500 mg in dextrose 5 % 250 mL IVPB (0 mg Intravenous Stopped 11/15/16 2214)  cefTRIAXone (ROCEPHIN) IVPB 1 g (1 g Intravenous Given 11/15/16 2015)     NEW OUTPATIENT MEDICATIONS STARTED DURING THIS VISIT:  Current Discharge Medication List      Current Discharge Medication List      Current Discharge  Medication List       Note:  This document was prepared using Dragon voice recognition software and may include unintentional dictation errors.    Loleta Roseory Briasia Flinders, MD 11/16/16 [phone]

## 2016-11-15 NOTE — Progress Notes (Signed)
Pharmacy Antibiotic Note  Gregory [name] is a [age] y.o. male admitted on 11/15/2016 with pneumonia.  Pharmacy has been consulted for ceftriaxone dosing.  Plan: Ceftriaxone 1 gram q 24 hours ordered.  Weight: 180 lb (81.6 kg)  Temp (24hrs), Avg:98.4 F (36.9 C), Min:98.2 F (36.8 C), Max:98.5 F (36.9 C)   Recent Labs Lab 11/15/16 1736  WBC 2.2*  CREATININE 1.40*    Estimated Creatinine Clearance: 39.3 mL/min (by C-G formula based on SCr of 1.4 mg/dL (H)).    Allergies  Allergen Reactions  . Cortisone Hives  . Penicillins Hives and Other (See Comments)    Unable to obtain enough information to answer additional questions about this medication.      Antimicrobials this admission: ceftriaxone 12/27 >>  azithromycin 12/27 >>   Dose adjustments this admission:   Microbiology results: 12/27 BCx: pending     Thank you for allowing pharmacy to be a part of this patient'[name] care.  Gregory [name] 11/15/2016 10:50 PM

## 2016-11-16 ENCOUNTER — Inpatient Hospital Stay
Admit: 2016-11-16 | Discharge: 2016-11-16 | Disposition: A | Discharge status: Home / Self Care | Payer: Medicare Other | Attending: Internal Medicine | Admitting: Internal Medicine

## 2016-11-16 DIAGNOSIS — R0602 Shortness of breath: Secondary | ICD-10-CM | POA: Diagnosis not present

## 2016-11-16 DIAGNOSIS — Z7189 Other specified counseling: Secondary | ICD-10-CM

## 2016-11-16 DIAGNOSIS — D72819 Decreased white blood cell count, unspecified: Secondary | ICD-10-CM

## 2016-11-16 DIAGNOSIS — Z515 Encounter for palliative care: Secondary | ICD-10-CM

## 2016-11-16 DIAGNOSIS — J441 Chronic obstructive pulmonary disease with (acute) exacerbation: Secondary | ICD-10-CM | POA: Diagnosis not present

## 2016-11-16 LAB — ECHOCARDIOGRAM COMPLETE
Height: 70 in
WEIGHTICAEL: 2423.3 [oz_av]

## 2016-11-16 LAB — BASIC METABOLIC PANEL
Anion gap: 5 (ref 5–15)
BUN: 27 mg/dL — ABNORMAL HIGH (ref 6–20)
CHLORIDE: 108 mmol/L (ref 101–111)
CO2: 24 mmol/L (ref 22–32)
CREATININE: 1.23 mg/dL (ref 0.61–1.24)
Calcium: 8 mg/dL — ABNORMAL LOW (ref 8.9–10.3)
GFR calc Af Amer: >60 mL/min (ref >60)
GFR calc non Af Amer: 52 mL/min — ABNORMAL LOW (ref >60)
Glucose, Bld: 134 mg/dL — ABNORMAL HIGH (ref 65–99)
Potassium: 3.6 mmol/L (ref 3.5–5.1)
SODIUM: 137 mmol/L (ref 135–145)

## 2016-11-16 LAB — CBC
HCT: 32.7 % — ABNORMAL LOW (ref 40.0–52.0)
Hemoglobin: 10.7 g/dL — ABNORMAL LOW (ref 13.0–18.0)
MCH: 29.1 pg (ref 26.0–34.0)
MCHC: 32.7 g/dL (ref 32.0–36.0)
MCV: 88.8 fL (ref 80.0–100.0)
PLATELETS: 80 10*3/uL — AB (ref 150–440)
RBC: 3.69 MIL/uL — AB (ref 4.40–5.90)
RDW: 15.9 % — ABNORMAL HIGH (ref 11.5–14.5)
WBC: 1 10*3/uL — AB (ref 3.8–10.6)

## 2016-11-16 MED ORDER — AZITHROMYCIN 500 MG PO TABS
500.0000 mg | ORAL_TABLET | Freq: Every day | ORAL | Status: DC
  Administered 2016-11-16: 500 mg via ORAL
  Filled 2016-11-16: qty 1

## 2016-11-16 NOTE — Progress Notes (Signed)
Family Meeting Note  Advance Directive:yes  Today a meeting took place with the Patient and son-daughter in law.  Patient is unable to participate due ZO:XWRUEAto:Lacked capacity Dementia   The following clinical team members were present during this meeting:MD  The following were discussed:Patient's diagnosis: , Patient's progosis: < 12 months and Goals for treatment: DNR  Additional follow-up to be provided: Palliative care eval  Time spent during discussion:20 minutes  Delfino LovettVipul Ajanae Virag, MD

## 2016-11-16 NOTE — Progress Notes (Signed)
PHARMACIST - PHYSICIAN COMMUNICATION DR:   Willis CONCERNING: Antibiotic IV to Oral Route Change Policy  RECOMMENDATION: This patient is receiving Azithromycin by the intravenous route.  Based on criteria approved by the Pharmacy and Therapeutics Committee, the antibiotic(s) is/are being converted to the equivalent oral dose form(s).   DESCRIPTION: These criteria include:  Patient being treated for a respiratory tract infection, urinary tract infection, cellulitis or clostridium difficile associated diarrhea if on metronidazole  The patient is not neutropenic and does not exhibit a GI malabsorption state  The patient is eating (either orally or via tube) and/or has been taking other orally administered medications for a least 24 hours  The patient is improving clinically and has a Tmax < 100.5  If you have questions about this conversion, please contact the Pharmacy Department  []  ( [phone] )  [place] [x]  ( [phone] )  [facility] []  ( [phone]  [place] []  ( [phone]  [facility] []  ( [phone] )  [facility]     Eilam Shrewsbury D. Mary-Ann Pennella, PharmD  

## 2016-11-16 NOTE — Progress Notes (Signed)
*  PRELIMINARY RESULTS* Echocardiogram 2D Echocardiogram has been performed.  Gregory [name], Gregory [name] 11/16/2016, 7:48 AM

## 2016-11-16 NOTE — Progress Notes (Signed)
Notified Dr. Sheryle HailIamond of WBC count of 1.0. Protective precautions initiated.

## 2016-11-16 NOTE — Consult Note (Signed)
Consultation Note Date: 11/16/2016   Patient Name: Gregory [name]  DOB: 03/06/1931  MRN: [id number]  Age / Sex: 82 y.o., male  PCP: Marguerita Merles, MD Referring Physician: Max Sane, MD  Reason for Consultation: Establishing goals of care  HPI/Patient Profile: 82 y.o. male  with past medical history of dementia, systolic CHF, CAD,  obstructive sleep apnea, anemia, HLD, HTN, stroke, BPH admitted on 11/15/2016 with SOB thought to be COPD exacerbation although no official diagnosis of COPD. Son reports he did smoke many years previously but quit in 1984.  Clinical Assessment and Goals of Care: I met today with [name] along with son, Gregory [name], and daughter-in-law at bedside. They are receptive to conversation. [name] is confused at baseline but able to answer some basic questions and is very pleasant - no behavioral issues with his dementia. They say he walks independently (forgets/refuses walker) and occasionally will fall when misjudging sitting down. He had previously had a lot of weight loss but weight has been stable past few months. They say he often refuses food but when placed in front of him he mostly will eat.   When speaking with family about dementia and concern over neutropenia and what this could represent they are very clear. They are not interested in pursuing work up, invasive interventions, procedures but very much interested in comfort and QOL. Their main concern is caregiver burden as he lives with his daughter-in-law (at bedside) and her husband Gregory [name]. They provide 24/7 care and also work but this is becoming difficult on them. Gregory [name] is concerned with their ability to continue at this pace. They are interested in any assistance or placement available. Gregory [name] says that he has visited with multiple facilities and he is told that his father has made too much money for placement and that they  would be responsible for up to $170 each day. I have consulted case manager and clinical social worker to assist with options. Unclear if he could receive any assistance from programs such as PACE or CAPS?? -CMRN to assist.   I gave and explained MOST form to [place]. He says they are not interested in feeding tube or aggressive interventions. We did discuss hospice as well but they feel they are not ready for this yet. Explain that if there is a potential malignancy that he could decline and that hospice could be a good option for them. Discussed the difference between hospice at home/SNF vs hospice facility.   Primary Decision Maker NEXT OF KIN 3 sons but Gregory [name] and Gregory [name] are local    SUMMARY OF RECOMMENDATIONS   - DNR - QOL valued over quantity of life - No work up for neutropenia or invasive procedures  Code Status/Advance Care Planning:  DNR   Symptom Management:   Currently no symptoms  SOB has improved  Cough: He is expectorating and they say this is his baseline.   Palliative Prophylaxis:   Aspiration, Bowel Regimen and Delirium Protocol  Additional Recommendations (Limitations, Scope, Preferences):  No Artificial Feeding,  No Chemotherapy, No Hemodialysis, No Radiation, No Surgical Procedures and No Tracheostomy  Psycho-social/Spiritual:   Desire for further Chaplaincy support:no  Additional Recommendations: Caregiving  Support/Resources and Education on Hospice  Prognosis:   Unable to determine  Discharge Planning: To Be Determined      Primary Diagnoses: Present on Admission: . CAD (coronary artery disease) . HTN (hypertension) . HLD (hyperlipidemia) . Chronic combined systolic and diastolic CHF (congestive heart failure) (Gregory [name]) . Sick sinus syndrome (Gregory [name]) . COPD exacerbation ([place])   I have reviewed the medical record, interviewed the patient and family, and examined the patient. The following aspects are pertinent.  Past Medical History:  Diagnosis  Date  . BPH (benign prostatic hyperplasia)   . Chronic systolic CHF (congestive heart failure) (HCC)    EF of 22% with diastolic dysfunction, grade I  . Coronary artery disease    s/p CABG in 2008 and a recent catheterization which showed no new occlusion   . Dementia   . History of anemia    secondary to multiple AVMs  . Hyperlipidemia   . Hypertension   . Obstructive sleep apnea   . Stroke [facility])    Social History   Social History  . Marital status: Widowed    Spouse name: N/A  . Number of children: N/A  . Years of education: N/A   Social History Main Topics  . Smoking status: Former Smoker    Types: Cigarettes    Quit date: 11/20/1982  . Smokeless tobacco: Never Used  . Alcohol use No  . Drug use: No  . Sexual activity: No   Other Topics Concern  . None   Social History Narrative  . None   Family History  Problem Relation Age of Onset  . Cancer Mother     gastric  . Coronary artery disease Sister   . Lung cancer Brother    Scheduled Meds: . allopurinol  100 mg Oral Daily  . amiodarone  200 mg Oral Daily  . azithromycin  500 mg Oral Daily  . cefTRIAXone  1 g Intravenous Q24H  . donepezil  10 mg Oral QHS  . enoxaparin (LOVENOX) injection  40 mg Subcutaneous Q24H  . furosemide  20 mg Oral Daily  . methylPREDNISolone (SOLU-MEDROL) injection  60 mg Intravenous Q6H  . metoprolol tartrate  25 mg Oral BID  . pantoprazole  40 mg Oral Daily  . ramipril  2.5 mg Oral BID  . sodium chloride flush  3 mL Intravenous Q12H  . tamsulosin  0.4 mg Oral QHS   Continuous Infusions: PRN Meds:.acetaminophen **OR** acetaminophen, ipratropium-albuterol, ondansetron **OR** ondansetron (ZOFRAN) IV Allergies  Allergen Reactions  . Cortisone Hives  . Penicillins Hives and Other (See Comments)    Unable to obtain enough information to answer additional questions about this medication.     Review of Systems  Constitutional: Positive for activity change and appetite change.    Respiratory: Positive for cough and shortness of breath.   Neurological: Positive for weakness.    Physical Exam  Constitutional: He appears well-developed.  HENT:  Head: Normocephalic and atraumatic.  Cardiovascular:  Paced   Pulmonary/Chest: No accessory muscle usage. No tachypnea. No respiratory distress. He has rhonchi.  Abdominal: Soft. Normal appearance.  Neurological: He is alert. He is disoriented.  Nursing note and vitals reviewed.   Vital Signs: BP 114/61 (BP Location: Right Arm)   Pulse 81   Temp 98.1 F (36.7 C)   Resp 18   Ht _0  (1.778  m)   Wt 68.7 kg (151 lb 7.3 oz)   SpO2 98%   BMI 21.73 kg/m  Pain Assessment: No/denies pain   Pain Score: 0-No pain   SpO2: SpO2: 98 % O2 Device:SpO2: 98 % O2 Flow Rate: .   IO: Intake/output summary:  Intake/Output Summary (Last 24 hours) at 11/16/16 1931 Last data filed at 11/16/16 1500  Gross per 24 hour  Intake                0 ml  Output              400 ml  Net             -400 ml    LBM: Last BM Date: 11/16/16 Baseline Weight: Weight: 81.6 kg (180 lb) Most recent weight: Weight: 68.7 kg (151 lb 7.3 oz)     Palliative Assessment/Data:     Time In: 1530 Time Out: 1640 Time Total: 70mn Greater than 50%  of this time was spent counseling and coordinating care related to the above assessment and plan.  Signed by: AVinie Sill NP Palliative Medicine Team Pager # [phone](M-F 8a-5p) Team Phone # [phone](Nights/Weekends)

## 2016-11-16 NOTE — Progress Notes (Signed)
[facility] - [place] at [facility]   PATIENT NAME: Gregory [name]    MR#:  [id number]  DATE OF BIRTH:  03/06/1931  SUBJECTIVE:  CHIEF COMPLAINT:   Chief Complaint  Patient presents with  . Shortness of Breath  . Cough  Patient unable to provide any history due to severe dementia, family members at bedside who provided most of the history REVIEW OF SYSTEMS:  Review of Systems  Unable to perform ROS: Dementia   DRUG ALLERGIES:   Allergies  Allergen Reactions  . Cortisone Hives  . Penicillins Hives and Other (See Comments)    Unable to obtain enough information to answer additional questions about this medication.     VITALS:  Blood pressure 114/61, pulse 81, temperature 98.1 F (36.7 C), resp. rate 18, height 5\' 10"  (1.778 m), weight 68.7 kg (151 lb 7.3 oz), SpO2 98 %. PHYSICAL EXAMINATION:  Physical Exam  Constitutional: He is well-developed, well-nourished, and in no distress.  HENT:  Head: Normocephalic and atraumatic.  Eyes: Conjunctivae and EOM are normal. Pupils are equal, round, and reactive to light.  Neck: Normal range of motion. Neck supple. No tracheal deviation present. No thyromegaly present.  Cardiovascular: Normal rate, regular rhythm and normal heart sounds.   Pulmonary/Chest: Effort normal and breath sounds normal. No respiratory distress. He has no wheezes. He exhibits no tenderness.  Abdominal: Soft. Bowel sounds are normal. He exhibits no distension. There is no tenderness.  Musculoskeletal: Normal range of motion.  Neurological: He is alert. He is disoriented. No cranial nerve deficit.  Unable to assess him due to severe dementia.  He would not cooperate with exam  Skin: Skin is warm and dry. No rash noted.  Psychiatric: Affect normal.  Severe dementia   LABORATORY PANEL:   CBC  Recent Labs Lab 11/16/16 0458  WBC 1.0*  HGB 10.7*  HCT 32.7*  PLT 80*    ------------------------------------------------------------------------------------------------------------------ Chemistries   Recent Labs Lab 11/15/16 1736 11/16/16 0458  NA 138 137  K 3.8 3.6  CL 107 108  CO2 25 24  GLUCOSE 93 134*  BUN 27* 27*  CREATININE 1.40* 1.23  CALCIUM 8.3* 8.0*  AST 39  --   ALT 15*  --   ALKPHOS 146*  --   BILITOT 1.2  --    RADIOLOGY:  Dg Chest 2 View  Result Date: 11/15/2016 CLINICAL DATA:  Shortness of breath, cough, congestion EXAM: CHEST  2 VIEW COMPARISON:  07/25/2016 FINDINGS: Prior CABG. Left pacer remains in place, unchanged. Chronic density noted at the left base/ lingula, likely scarring. No confluent opacity on the right. No effusions or acute bony abnormality. IMPRESSION: Probable scarring in the left base/lingula. Cardiomegaly. No definite acute process. Electronically Signed   By: Charlett NoseKevin  Dover M.D.   On: 11/15/2016 18:26   ASSESSMENT AND PLAN:  82 y.o. male who presents with Several days of increased work of breathing and audible wheezing. Patient is unable to give much history due to dementia, family members present at bedside and provides most of the history   * COPD exacerbation (HCC) -  - Continue IV steroids and antibiotics with when necessary DuoNeb's.  * Pancytopenia - Patient's white count is dropping with severe leukopenia.  We will put him on neutropenic  Precaution - Patient's son reports he has had workup for this in 2015, before some surgery and it was essentially negative and do not want to pursue any further workup and rather keep him comfortable.  They do  realize this could be any underlying hematological malignancy  * Chronic combined systolic and diastolic CHF (congestive heart failure) (HCC) - continue home meds, does not seem to be an exacerbation, echo showed normal LV systolic function with EF of 60-65%  * CAD (coronary artery disease) - continue home meds   HTN (hypertension) - stable, continue home meds    HLD (hyperlipidemia) - continue home antilipid   Sick sinus syndrome (HCC) - pacemaker in place and functioning within normal limits     All the records are reviewed and case discussed with Care Management/Social Worker. Management plans discussed with the patient, family and they are in agreement.  CODE STATUS: DO NOT RESUSCITATE  TOTAL TIME TAKING CARE OF THIS PATIENT: 35 minutes.   More than 50% of the time was spent in counseling/coordination of care: YES (discussion with son and daughter-in-law at bedside)  POSSIBLE D/C IN AM, DEPENDING ON CLINICAL CONDITION.  Palliative care evaluation   Delfino LovettVipul Faviola Klare M.D on 11/16/2016 at 6:01 PM  Between 7am to 6pm - Pager - [phone]  After 6pm go to [email or web address] - Social research officer, governmentpassword EPAS [facility]  [facility]  Office  [phone]  CC: Primary care physician; Leanna SatoMILES,LINDA M, MD  Note: This dictation was prepared with Dragon dictation along with smaller phrase technology. Any transcriptional errors that result from this process are unintentional.

## 2016-11-16 NOTE — Progress Notes (Signed)
Confused. From home. Admitted with resp distress. On RA. No s/s distress. Resting in bed quietly.

## 2016-11-16 NOTE — Care Management (Signed)
Patient presents from home with shortness of breath and wheezing. History of dementia Admitted for exac of COPD and is not requiring supplemental oxygen.  Palliative care consult is pending.  Patient is DNR.

## 2016-11-17 DIAGNOSIS — R0602 Shortness of breath: Secondary | ICD-10-CM | POA: Diagnosis not present

## 2016-11-17 DIAGNOSIS — Z515 Encounter for palliative care: Secondary | ICD-10-CM

## 2016-11-17 DIAGNOSIS — Z7189 Other specified counseling: Secondary | ICD-10-CM

## 2016-11-17 DIAGNOSIS — J441 Chronic obstructive pulmonary disease with (acute) exacerbation: Secondary | ICD-10-CM | POA: Diagnosis not present

## 2016-11-17 LAB — CBC
HCT: 35.2 % — ABNORMAL LOW (ref 40.0–52.0)
Hemoglobin: 11.5 g/dL — ABNORMAL LOW (ref 13.0–18.0)
MCH: 28.7 pg (ref 26.0–34.0)
MCHC: 32.7 g/dL (ref 32.0–36.0)
MCV: 87.9 fL (ref 80.0–100.0)
PLATELETS: 85 10*3/uL — AB (ref 150–440)
RBC: 4 MIL/uL — AB (ref 4.40–5.90)
RDW: 16 % — AB (ref 11.5–14.5)
WBC: 3.5 10*3/uL — AB (ref 3.8–10.6)

## 2016-11-17 LAB — PROCALCITONIN

## 2016-11-17 MED ORDER — IPRATROPIUM-ALBUTEROL 0.5-2.5 (3) MG/3ML IN SOLN: 3.0000 mL | Freq: Four times a day (QID) | RESPIRATORY_TRACT | Status: DC | PRN

## 2016-11-17 MED ORDER — AZITHROMYCIN 500 MG PO TABS: 500.0000 mg | ORAL_TABLET | Freq: Every day | ORAL | 0 refills | Status: DC

## 2016-11-17 MED ORDER — PREDNISONE 10 MG (21) PO TBPK: 10.0000 mg | ORAL_TABLET | Freq: Every day | ORAL | 0 refills | Status: AC

## 2016-11-17 NOTE — Progress Notes (Signed)
New referral for Palliative services in the home [facility] Ermalene SearingNann Greene. Plan is for patient to discharge home today. Referral made aware. Thank you. Dayna BarkerKaren Robertson RN, BSN, [facility] [place], hospital Liaison [phone] c

## 2016-11-17 NOTE — Progress Notes (Signed)
IV and tele removed from patient. Discharge instructions given to patient and patients family members at bedside. Family verbalized understanding. No distress at this time. Family will be transporting patient home.

## 2016-11-17 NOTE — Evaluation (Signed)
Physical Therapy Evaluation Patient Details Name: Gregory [name] MRN: [id number] DOB: 03/06/1931 Today's Date: 11/17/2016   History of Present Illness  Pt is a 82 y/o M who presents with several days of increased WOB and audible wheezing. Workup and imaging consistent with COPD exacerbation and pancytopenia. Pt's PMH includes CHF, sick sinus syndrome, dementia, stroke.    Clinical Impression  Pt admitted with above diagnosis. Pt currently with functional limitations due to the deficits listed below (see PT Problem List). Gregory [name] lives with his son and daughter in law who provide 24/7 care; however they have expressed concerns about their ability to continue providing 24/7 care as they both work.  Gregory [name] is unsteady while ambulating at baseline and requires assist for bathing and dressing.  He required close min guard assist for verbal directional cues (impaired vision at baseline) and due to instability and drifting L and R while ambulating.  Pt will benefit from PT at d/c regardless of d/c plan (SNF vs home to be determined, please see Palliative note) pending amount of assist that will be available from family as pt needs 24/7 assist.  Pt will benefit from skilled PT to increase their independence and safety with mobility to allow discharge to the venue listed below.      Follow Up Recommendations Home health PT;SNF;Supervision/Assistance - 24 hour    Equipment Recommendations  None recommended by PT    Recommendations for Other Services       Precautions / Restrictions Precautions Precautions: Fall Restrictions Weight Bearing Restrictions: No      Mobility  Bed Mobility Overal bed mobility: Independent             General bed mobility comments: No assist or cues needed  Transfers Overall transfer level: Needs assistance Equipment used: None Transfers: Sit to/from Stand Sit to Stand: Min guard         General transfer comment: Min guard for safety as pt  with mild instability but no LOB  Ambulation/Gait Ambulation/Gait assistance: Min guard Ambulation Distance (Feet): 60 Feet Assistive device: None Gait Pattern/deviations: Step-through pattern;Decreased stride length;Trunk flexed;Drifts right/left Gait velocity: decreased Gait velocity interpretation: Below normal speed for age/gender General Gait Details: Pt unsteady and drifts L and R and requires verbal directional cues due to poor vision.  Close min guard assist needed.  Stairs            Wheelchair Mobility    Modified Rankin (Stroke Patients Only)       Balance Overall balance assessment: Needs assistance;History of Falls Sitting-balance support: No upper extremity supported;Feet supported Sitting balance-Leahy Scale: Good     Standing balance support: No upper extremity supported;During functional activity Standing balance-Leahy Scale: Fair                               Pertinent Vitals/Pain Pain Assessment: No/denies pain    Home Living Family/patient expects to be discharged to:: Private residence Living Arrangements: Children Available Help at Discharge: Family;Available 24 hours/day Type of Home: Mobile home Home Access: Stairs to enter Entrance Stairs-Rails: Left;Right;Can reach both Entrance Stairs-Number of Steps: 3 Home Layout: One level Home Equipment: Walker - 2 wheels;Cane - single point;Tub bench;Grab bars - tub/shower;Bedside commode Additional Comments: Per chart review son and daughter in law provide 24/7 care.    Prior Function Level of Independence: Needs assistance   Gait / Transfers Assistance Needed: Ambulates without AD (refuses to use RW or  cane), unsteady.  Loses his balance often when going to sit as he forgets to reach back for the seat and sits prematurely.  Pt has slid off edge of chair a few times in the past 6 months.  ADL's / Homemaking Assistance Needed: Needs assist for bathing, dressing.   Comments: Son  reports pt with macular degeneration     Hand Dominance   Dominant Hand: Right    Extremity/Trunk Assessment   Upper Extremity Assessment Upper Extremity Assessment: Overall WFL for tasks assessed    Lower Extremity Assessment Lower Extremity Assessment: Overall WFL for tasks assessed    Cervical / Trunk Assessment Cervical / Trunk Assessment: Kyphotic  Communication   Communication: HOH  Cognition Arousal/Alertness: Awake/alert Behavior During Therapy: WFL for tasks assessed/performed Overall Cognitive Status: History of cognitive impairments - at baseline                      General Comments      Exercises     Assessment/Plan    PT Assessment Patient needs continued PT services  PT Problem List Decreased balance;Decreased cognition;Decreased knowledge of use of DME;Decreased safety awareness          PT Treatment Interventions Gait training;DME instruction;Stair training;Functional mobility training;Therapeutic activities;Therapeutic exercise;Balance training;Neuromuscular re-education;Patient/family education    PT Goals (Current goals can be found in the Care Plan section)  Acute Rehab PT Goals Patient Stated Goal: to go home PT Goal Formulation: With patient/family Time For Goal Achievement: 12/01/16 Potential to Achieve Goals: Good    Frequency Min 2X/week   Barriers to discharge Decreased caregiver support Son and daughter in law concerned about their ability to continue providing 24/7 care    Co-evaluation               End of Session Equipment Utilized During Treatment: Gait belt Activity Tolerance: Patient tolerated treatment well Patient left: in chair;with call bell/phone within reach;with chair alarm set;with family/visitor present;Other (comment) (with MD (Dr. Sherryll BurgerShah) in room) Nurse Communication: Mobility status         Time: [id number] PT Time Calculation (min) (ACUTE ONLY): 24 min   Charges:   PT Evaluation $PT Eval  Low Complexity: 1 Procedure PT Treatments $Gait Training: 8-22 mins   PT G Codes:        Encarnacion ChuAshley Abashian PT, DPT 11/17/2016, 2:50 PM

## 2016-11-17 NOTE — Discharge Instructions (Signed)
Chronic Obstructive Pulmonary Disease Chronic obstructive pulmonary disease (COPD) is a common lung problem. In COPD, the flow of air from the lungs is limited. The way your lungs work will probably never return to normal, but there are things you can do to improve your lungs and make yourself feel better. Your doctor may treat your condition with:  Medicines.  Oxygen.  Lung surgery.  Changes to your diet.  Rehabilitation. This may involve a team of specialists. Follow these instructions at home:  Take all medicines as told by your doctor.  Avoid medicines or cough syrups that dry up your airway (such as antihistamines) and do not allow you to get rid of thick spit. You do not need to avoid them if told differently by your doctor.  If you smoke, stop. Smoking makes the problem worse.  Avoid being around things that make your breathing worse (like smoke, chemicals, and fumes).  Use oxygen therapy and therapy to help improve your lungs (pulmonary rehabilitation) if told by your doctor. If you need home oxygen therapy, ask your doctor if you should buy a tool to measure your oxygen level (oximeter).  Avoid people who have a sickness you can catch (contagious).  Avoid going outside when it is very hot, cold, or humid.  Eat healthy foods. Eat smaller meals more often. Rest before meals.  Stay active, but remember to also rest.  Make sure to get all the shots (vaccines) your doctor recommends. Ask your doctor if you need a pneumonia shot.  Learn and use tips on how to relax.  Learn and use tips on how to control your breathing as told by your doctor. Try: 1. Breathing in (inhaling) through your nose for 1 second. Then, pucker your lips and breath out (exhale) through your lips for 2 seconds. 2. Putting one hand on your belly (abdomen). Breathe in slowly through your nose for 1 second. Your hand on your belly should move out. Pucker your lips and breathe out slowly through your lips.  Your hand on your belly should move in as you breathe out.  Learn and use controlled coughing to clear thick spit from your lungs. The steps are: 1. Lean your head a little forward. 2. Breathe in deeply. 3. Try to hold your breath for 3 seconds. 4. Keep your mouth slightly open while coughing 2 times. 5. Spit any thick spit out into a tissue. 6. Rest and do the steps again 1 or 2 times as needed. Contact a doctor if:  You cough up more thick spit than usual.  There is a change in the color or thickness of the spit.  It is harder to breathe than usual.  Your breathing is faster than usual. Get help right away if:  You have shortness of breath while resting.  You have shortness of breath that stops you from:  Being able to talk.  Doing normal activities.  You chest hurts for longer than 5 minutes.  Your skin color is more blue than usual.  Your pulse oximeter shows that you have low oxygen for longer than 5 minutes. This information is not intended to replace advice given to you by your health care provider. Make sure you discuss any questions you have with your health care provider. Document Released: 04/24/2008 Document Revised: 04/13/2016 Document Reviewed: 07/03/2013 Elsevier Interactive Patient Education  2017 Elsevier Inc.  

## 2016-11-17 NOTE — Care Management (Signed)
patient presents from home with shortness of breath. He is on neutropenic precautions. This is a new finding for this patient. History of dementia.   Spoke with patient's son- family is  in the process of finding caregivers that can cover for the primary caregiver- a family member- to give her a break.   Patient does not qualify for medicaid assistance per son. Family members themselves are arranging for round the clock care between themselves. Patient has access to walkers but does not use.  His falls result from not looking back to see if he is close enough to a chair or bed before attempting to sit.  Son is agreeable to home health.Obtained order for SN PT OT Aide and SW. Agency preference is [facility].  Notified Clydie BraunKaren with palliative care of outpatient palliative care referral.

## 2016-11-17 NOTE — Clinical Social Work Note (Signed)
CSW received consult that patient's family had questions about possible higher level of care.  CSW met with patient's son and explained how if they think patient may need long term care Medicaid they should contact DSS and find out if he would be eligible phone number was given to family.  CSW also talked to patient's son regarding the difference between ALFs and SNFs, and gave him a list of ALFs in [place].  CSW talked to family about PACE program and how patient may benefit if they look into it.  CSW gave the son information about [facility] program and how to contact them to find out more information.  Patient's family would like patient to return home with home health.  CSW to sign off please reconsult if other social work needs arise.  Jones Broom. [place], MSW, [place]  Mon-Fri 8a-4:30p 11/17/2016 5:48 PM

## 2016-11-17 NOTE — Progress Notes (Signed)
Pharmacy Antibiotic Note  Jolyne LoaWilliam A Carte is a 82 y.o. male admitted on 11/15/2016 with pneumonia.  Pharmacy has been consulted for ceftriaxone dosing.  Plan: Ceftriaxone 1 gram q 24 hours ordered.  Height: 5\' 10"  (177.8 cm) Weight: 151 lb 0.2 oz (68.5 kg) IBW/kg (Calculated) : 73  Temp (24hrs), Avg:97.9 F (36.6 C), Min:97.7 F (36.5 C), Max:98.1 F (36.7 C)   Recent Labs Lab 11/15/16 1736 11/16/16 0458  WBC 2.2* 1.0*  CREATININE 1.40* 1.23    Estimated Creatinine Clearance: 44.1 mL/min (by C-G formula based on SCr of 1.23 mg/dL).    Allergies  Allergen Reactions  . Cortisone Hives  . Penicillins Hives and Other (See Comments)    Unable to obtain enough information to answer additional questions about this medication.      Antimicrobials this admission: ceftriaxone 12/27 >>  azithromycin 12/27 >>   Dose adjustments this admission:   Microbiology results: 12/27 BCx: pending     Thank you for allowing pharmacy to be a part of this patient's care.  Maili Shutters D 11/17/2016 11:22 AM

## 2016-11-18 NOTE — Discharge Summary (Signed)
[facility] [place] [facility]   PATIENT NAME: Gregory [name]    MR#:  [id number]  DATE OF BIRTH:  03/06/1931  DATE OF ADMISSION:  11/15/2016   ADMITTING PHYSICIAN: Oralia Manisavid Willis, MD  DATE OF DISCHARGE: 11/17/2016  4:12 PM  PRIMARY CARE PHYSICIAN: Leanna SatoMILES,LINDA M, MD   ADMISSION DIAGNOSIS:  Acute respiratory distress [R06.03] Confusion [R41.0] Weakness [R53.1] Leukopenia, unspecified type [D72.819] DISCHARGE DIAGNOSIS:  Principal Problem:   COPD exacerbation (HCC) Active Problems:   CAD (coronary artery disease)   HTN (hypertension)   HLD (hyperlipidemia)   Chronic combined systolic and diastolic CHF (congestive heart failure) (HCC)   Sick sinus syndrome (HCC)   Goals of care, counseling/discussion   Palliative care encounter  SECONDARY DIAGNOSIS:   Past Medical History:  Diagnosis Date  . BPH (benign prostatic hyperplasia)   . Chronic systolic CHF (congestive heart failure) (HCC)    EF of 45% with diastolic dysfunction, grade I  . Coronary artery disease    s/p CABG in 2008 and a recent catheterization which showed no new occlusion   . Dementia   . History of anemia    secondary to multiple AVMs  . Hyperlipidemia   . Hypertension   . Obstructive sleep apnea   . Stroke [facility](HCC)    HOSPITAL COURSE:  82 y.o.malewho presents with Several days of increased work of breathing and audible wheezing. Patient is unable to give much history due to dementia, family members present at bedside and provides most of the history   * COPD exacerbation (HCC) -  - Improved with steroids, Nebs and antibiotics  * Pancytopenia - leukopenia improved. Can be due to viral infection - Patient's son reports he has had workup for this in 2015, before some surgery and it was essentially negative and do not want to pursue any further workup and rather keep him comfortable.  They do realize this could be any underlying hematological malignancy  *Chronic combined  systolic and diastolic CHF (congestive heart failure) (HCC) - continue home meds, does not seem to be an exacerbation, echo showed normal LV systolic function with EF of 60-65%  *CAD (coronary artery disease) - continue home meds HTN (hypertension) - stable, continue home meds HLD (hyperlipidemia) - continue home antilipid Sick sinus syndrome (HCC) - pacemaker in place and functioning within normal limits  DISCHARGE CONDITIONS:  stable CONSULTS OBTAINED:   DRUG ALLERGIES:   Allergies  Allergen Reactions  . Cortisone Hives  . Penicillins Hives and Other (See Comments)    Unable to obtain enough information to answer additional questions about this medication.     DISCHARGE MEDICATIONS:   Allergies as of 11/17/2016      Reactions   Cortisone Hives   Penicillins Hives, Other (See Comments)   Unable to obtain enough information to answer additional questions about this medication.        Medication List    TAKE these medications   allopurinol 100 MG tablet Commonly known as:  ZYLOPRIM Take 100 mg by mouth daily.   amiodarone 200 MG tablet Commonly known as:  PACERONE Take 200 mg by mouth daily.   azithromycin 500 MG tablet Commonly known as:  ZITHROMAX Take 1 tablet (500 mg total) by mouth daily.   donepezil 10 MG tablet Commonly known as:  ARICEPT Take 10 mg by mouth at bedtime.   furosemide 20 MG tablet Commonly known as:  LASIX Take 20 mg by mouth.   metoprolol tartrate 25 MG tablet Commonly  known as:  LOPRESSOR Take 1 tablet (25 mg total) by mouth 2 (two) times daily.   omeprazole 20 MG capsule Commonly known as:  PRILOSEC Take 20 mg by mouth daily.   potassium chloride 10 MEQ tablet Commonly known as:  K-DUR,KLOR-CON Take 10 mEq by mouth daily.   predniSONE 10 MG (21) Tbpk tablet Commonly known as:  STERAPRED UNI-PAK 21 TAB Take 1 tablet (10 mg total) by mouth daily. start 60 mg by mouth daily, taper 10 mg daily until finished   ramipril 2.5  MG capsule Commonly known as:  ALTACE Take 2.5 mg by mouth 2 (two) times daily.   tamsulosin 0.4 MG Caps capsule Commonly known as:  FLOMAX Take 0.4 mg by mouth at bedtime.        DISCHARGE INSTRUCTIONS:   DIET:  Regular diet DISCHARGE CONDITION:  Good ACTIVITY:  Activity as tolerated OXYGEN:  Home Oxygen: No.  Oxygen Delivery: room air DISCHARGE LOCATION:  home with home health, PT, RN, sw. Palliative care eval at home  If you experience worsening of your admission symptoms, develop shortness of breath, life threatening emergency, suicidal or homicidal thoughts you must seek medical attention immediately by calling 911 or calling your MD immediately  if symptoms less severe.  You Must read complete instructions/literature along with all the possible adverse reactions/side effects for all the Medicines you take and that have been prescribed to you. Take any new Medicines after you have completely understood and accpet all the possible adverse reactions/side effects.   Please note  You were cared for by a hospitalist during your hospital stay. If you have any questions about your discharge medications or the care you received while you were in the hospital after you are discharged, you can call the unit and asked to speak with the hospitalist on call if the hospitalist that took care of you is not available. Once you are discharged, your primary care physician will handle any further medical issues. Please note that NO REFILLS for any discharge medications will be authorized once you are discharged, as it is imperative that you return to your primary care physician (or establish a relationship with a primary care physician if you do not have one) for your aftercare needs so that they can reassess your need for medications and monitor your lab values.    On the day of Discharge:  VITAL SIGNS:  Blood pressure (!) 116/55, pulse 61, temperature 97.9 F (36.6 C), temperature source  Oral, resp. rate 12, height 5\' 10"  (1.778 m), weight 68.5 kg (151 lb 0.2 oz), SpO2 98 %. PHYSICAL EXAMINATION:  GENERAL:  82 y.o.-year-old patient lying in the bed with no acute distress.  EYES: Pupils equal, round, reactive to light and accommodation. No scleral icterus. Extraocular muscles intact.  HEENT: Head atraumatic, normocephalic. Oropharynx and nasopharynx clear.  NECK:  Supple, no jugular venous distention. No thyroid enlargement, no tenderness.  LUNGS: Normal breath sounds bilaterally, no wheezing, rales,rhonchi or crepitation. No use of accessory muscles of respiration.  CARDIOVASCULAR: S1, S2 normal. No murmurs, rubs, or gallops.  ABDOMEN: Soft, non-tender, non-distended. Bowel sounds present. No organomegaly or mass.  EXTREMITIES: No pedal edema, cyanosis, or clubbing.  NEUROLOGIC: Cranial nerves II through XII are intact. Muscle strength 5/5 in all extremities. Sensation intact. Gait not checked.  PSYCHIATRIC: The patient is alert and oriented x 3.  SKIN: No obvious rash, lesion, or ulcer.  DATA REVIEW:   CBC  Recent Labs Lab 11/17/16 1338  WBC 3.5*  HGB 11.5*  HCT 35.2*  PLT 85*    Chemistries   Recent Labs Lab 11/15/16 1736 11/16/16 0458  NA 138 137  K 3.8 3.6  CL 107 108  CO2 25 24  GLUCOSE 93 134*  BUN 27* 27*  CREATININE 1.40* 1.23  CALCIUM 8.3* 8.0*  AST 39  --   ALT 15*  --   ALKPHOS 146*  --   BILITOT 1.2  --     Follow-up Information    [facility] Follow up.   Why:  Home Health nurse and physical therapy Contact information: [address] [place] [address] [phone]        [facility] Follow up.   Why:  referral in prgress [phone]       MILES,LINDA M, MD. Schedule an appointment as soon as possible for a visit in 1 week(s).   Specialty:  Family Medicine Contact information: [place] [place] [address] [phone]           Management plans  discussed with the patient, family and they are in agreement.  CODE STATUS: DNR  TOTAL TIME TAKING CARE OF THIS PATIENT: 45 minutes.    Delfino LovettVipul Dylan Ruotolo M.D on 11/18/2016 at 10:10 AM  Between 7am to 6pm - Pager - [phone]  After 6pm go to [email or web address] - Social research officer, governmentpassword EPAS [facility]  [facility] [place] [facility]  Office  [phone]  CC: Primary care physician; Leanna SatoMILES,LINDA M, MD   Note: This dictation was prepared with Dragon dictation along with smaller phrase technology. Any transcriptional errors that result from this process are unintentional.

## 2016-11-20 LAB — CULTURE, BLOOD (ROUTINE X 2)
CULTURE: NO GROWTH
Culture: NO GROWTH

## 2016-12-02 ENCOUNTER — Encounter: Payer: Self-pay | Admitting: Emergency Medicine

## 2016-12-02 ENCOUNTER — Emergency Department: Payer: Medicare Other

## 2016-12-02 ENCOUNTER — Observation Stay
Admission: EM | Admit: 2016-12-02 | Discharge: 2016-12-03 | Disposition: A | Discharge status: Home / Self Care | Payer: Medicare Other | Attending: Internal Medicine | Admitting: Internal Medicine

## 2016-12-02 DIAGNOSIS — N189 Chronic kidney disease, unspecified: Secondary | ICD-10-CM | POA: Diagnosis not present

## 2016-12-02 DIAGNOSIS — I739 Peripheral vascular disease, unspecified: Secondary | ICD-10-CM | POA: Diagnosis not present

## 2016-12-02 DIAGNOSIS — N400 Enlarged prostate without lower urinary tract symptoms: Secondary | ICD-10-CM | POA: Insufficient documentation

## 2016-12-02 DIAGNOSIS — Z87891 Personal history of nicotine dependence: Secondary | ICD-10-CM | POA: Insufficient documentation

## 2016-12-02 DIAGNOSIS — I4891 Unspecified atrial fibrillation: Secondary | ICD-10-CM | POA: Insufficient documentation

## 2016-12-02 DIAGNOSIS — E876 Hypokalemia: Secondary | ICD-10-CM | POA: Diagnosis not present

## 2016-12-02 DIAGNOSIS — Z950 Presence of cardiac pacemaker: Secondary | ICD-10-CM | POA: Insufficient documentation

## 2016-12-02 DIAGNOSIS — E782 Mixed hyperlipidemia: Secondary | ICD-10-CM | POA: Insufficient documentation

## 2016-12-02 DIAGNOSIS — I5043 Acute on chronic combined systolic (congestive) and diastolic (congestive) heart failure: Secondary | ICD-10-CM | POA: Insufficient documentation

## 2016-12-02 DIAGNOSIS — I2510 Atherosclerotic heart disease of native coronary artery without angina pectoris: Secondary | ICD-10-CM | POA: Insufficient documentation

## 2016-12-02 DIAGNOSIS — I130 Hypertensive heart and chronic kidney disease with heart failure and stage 1 through stage 4 chronic kidney disease, or unspecified chronic kidney disease: Secondary | ICD-10-CM | POA: Diagnosis not present

## 2016-12-02 DIAGNOSIS — Z8673 Personal history of transient ischemic attack (TIA), and cerebral infarction without residual deficits: Secondary | ICD-10-CM | POA: Insufficient documentation

## 2016-12-02 DIAGNOSIS — Z951 Presence of aortocoronary bypass graft: Secondary | ICD-10-CM | POA: Diagnosis not present

## 2016-12-02 DIAGNOSIS — Z888 Allergy status to other drugs, medicaments and biological substances status: Secondary | ICD-10-CM | POA: Diagnosis not present

## 2016-12-02 DIAGNOSIS — F0150 Vascular dementia without behavioral disturbance: Secondary | ICD-10-CM | POA: Diagnosis not present

## 2016-12-02 DIAGNOSIS — R748 Abnormal levels of other serum enzymes: Secondary | ICD-10-CM | POA: Insufficient documentation

## 2016-12-02 DIAGNOSIS — J449 Chronic obstructive pulmonary disease, unspecified: Secondary | ICD-10-CM | POA: Insufficient documentation

## 2016-12-02 DIAGNOSIS — I700 Atherosclerosis of aorta: Secondary | ICD-10-CM | POA: Insufficient documentation

## 2016-12-02 DIAGNOSIS — Z880 Allergy status to penicillin: Secondary | ICD-10-CM | POA: Diagnosis not present

## 2016-12-02 DIAGNOSIS — Z9889 Other specified postprocedural states: Secondary | ICD-10-CM | POA: Insufficient documentation

## 2016-12-02 DIAGNOSIS — G4733 Obstructive sleep apnea (adult) (pediatric): Secondary | ICD-10-CM | POA: Insufficient documentation

## 2016-12-02 DIAGNOSIS — Z66 Do not resuscitate: Secondary | ICD-10-CM | POA: Insufficient documentation

## 2016-12-02 DIAGNOSIS — Z7982 Long term (current) use of aspirin: Secondary | ICD-10-CM | POA: Diagnosis not present

## 2016-12-02 DIAGNOSIS — R7989 Other specified abnormal findings of blood chemistry: Secondary | ICD-10-CM

## 2016-12-02 DIAGNOSIS — R079 Chest pain, unspecified: Principal | ICD-10-CM | POA: Diagnosis present

## 2016-12-02 DIAGNOSIS — R778 Other specified abnormalities of plasma proteins: Secondary | ICD-10-CM

## 2016-12-02 LAB — CBC
HCT: 38 % — ABNORMAL LOW (ref 40.0–52.0)
Hemoglobin: 12.7 g/dL — ABNORMAL LOW (ref 13.0–18.0)
MCH: 29.1 pg (ref 26.0–34.0)
MCHC: 33.5 g/dL (ref 32.0–36.0)
MCV: 86.8 fL (ref 80.0–100.0)
PLATELETS: 140 10*3/uL — AB (ref 150–440)
RBC: 4.38 MIL/uL — ABNORMAL LOW (ref 4.40–5.90)
RDW: 16.2 % — AB (ref 11.5–14.5)
WBC: 5.5 10*3/uL (ref 3.8–10.6)

## 2016-12-02 LAB — BASIC METABOLIC PANEL
ANION GAP: 9 (ref 5–15)
BUN: 17 mg/dL (ref 6–20)
CHLORIDE: 101 mmol/L (ref 101–111)
CO2: 25 mmol/L (ref 22–32)
Calcium: 8.3 mg/dL — ABNORMAL LOW (ref 8.9–10.3)
Creatinine, Ser: 0.94 mg/dL (ref 0.61–1.24)
GFR calc non Af Amer: >60 mL/min (ref >60)
Glucose, Bld: 79 mg/dL (ref 65–99)
POTASSIUM: 3.3 mmol/L — AB (ref 3.5–5.1)
SODIUM: 135 mmol/L (ref 135–145)

## 2016-12-02 LAB — TROPONIN I
Troponin I: 0.04 ng/mL (ref <0.03)
Troponin I: 0.03 ng/mL (ref <0.03)

## 2016-12-02 LAB — MAGNESIUM: MAGNESIUM: 1.6 mg/dL — AB (ref 1.7–2.4)

## 2016-12-02 MED ORDER — POTASSIUM CHLORIDE CRYS ER 20 MEQ PO TBCR
40.0000 meq | EXTENDED_RELEASE_TABLET | Freq: Once | ORAL | Status: AC
  Administered 2016-12-02: 40 meq via ORAL
  Filled 2016-12-02: qty 2

## 2016-12-02 MED ORDER — PANTOPRAZOLE SODIUM 40 MG PO TBEC
40.0000 mg | DELAYED_RELEASE_TABLET | Freq: Every day | ORAL | Status: DC
  Administered 2016-12-03: 40 mg via ORAL
  Filled 2016-12-02: qty 1

## 2016-12-02 MED ORDER — DONEPEZIL HCL 5 MG PO TABS
10.0000 mg | ORAL_TABLET | Freq: Every day | ORAL | Status: DC
  Administered 2016-12-02: 10 mg via ORAL
  Filled 2016-12-02: qty 2

## 2016-12-02 MED ORDER — ALPRAZOLAM 0.25 MG PO TABS: 0.2500 mg | ORAL_TABLET | Freq: Two times a day (BID) | ORAL | Status: DC | PRN

## 2016-12-02 MED ORDER — ASPIRIN 81 MG PO CHEW
324.0000 mg | CHEWABLE_TABLET | Freq: Once | ORAL | Status: AC
  Administered 2016-12-02: 324 mg via ORAL
  Filled 2016-12-02: qty 4

## 2016-12-02 MED ORDER — ALLOPURINOL 100 MG PO TABS
100.0000 mg | ORAL_TABLET | Freq: Every day | ORAL | Status: DC
  Administered 2016-12-03: 100 mg via ORAL
  Filled 2016-12-02: qty 1

## 2016-12-02 MED ORDER — AMIODARONE HCL 200 MG PO TABS
200.0000 mg | ORAL_TABLET | Freq: Every day | ORAL | Status: DC
  Administered 2016-12-02 – 2016-12-03 (×2): 200 mg via ORAL
  Filled 2016-12-02 (×2): qty 1

## 2016-12-02 MED ORDER — ONDANSETRON HCL 4 MG/2ML IJ SOLN: 4.0000 mg | Freq: Four times a day (QID) | INTRAMUSCULAR | Status: DC | PRN

## 2016-12-02 MED ORDER — SODIUM CHLORIDE 0.9 % IV BOLUS (SEPSIS)
1000.0000 mL | Freq: Once | INTRAVENOUS | Status: AC
  Administered 2016-12-02: 1000 mL via INTRAVENOUS

## 2016-12-02 MED ORDER — MORPHINE SULFATE (PF) 4 MG/ML IV SOLN: 2.0000 mg | INTRAVENOUS | Status: DC | PRN

## 2016-12-02 MED ORDER — ASPIRIN EC 325 MG PO TBEC
325.0000 mg | DELAYED_RELEASE_TABLET | Freq: Every day | ORAL | Status: DC
  Administered 2016-12-03: 325 mg via ORAL
  Filled 2016-12-02: qty 1

## 2016-12-02 MED ORDER — GI COCKTAIL ~~LOC~~
30.0000 mL | Freq: Four times a day (QID) | ORAL | Status: DC | PRN
  Filled 2016-12-02: qty 30

## 2016-12-02 MED ORDER — OXYCODONE-ACETAMINOPHEN 5-325 MG PO TABS: 1.0000 | ORAL_TABLET | ORAL | Status: DC | PRN

## 2016-12-02 MED ORDER — TAMSULOSIN HCL 0.4 MG PO CAPS
0.4000 mg | ORAL_CAPSULE | Freq: Every day | ORAL | Status: DC
  Administered 2016-12-02: 0.4 mg via ORAL
  Filled 2016-12-02: qty 1

## 2016-12-02 MED ORDER — ACETAMINOPHEN 325 MG PO TABS: 650.0000 mg | ORAL_TABLET | ORAL | Status: DC | PRN

## 2016-12-02 MED ORDER — ENOXAPARIN SODIUM 40 MG/0.4ML ~~LOC~~ SOLN
40.0000 mg | SUBCUTANEOUS | Status: DC
  Administered 2016-12-02: 40 mg via SUBCUTANEOUS
  Filled 2016-12-02: qty 0.4

## 2016-12-02 NOTE — ED Notes (Signed)
Family reports pt. Stated he had chest pain early this morning with shortness of breath.  Pt. Denies pain at this time.  Pt. Has hx of dementia.

## 2016-12-02 NOTE — ED Provider Notes (Signed)
[facility] Emergency Department Provider Note    L5 caveat: Review of systems and history is limited by dementia    Time seen: ----------------------------------------- 6:11 PM on 12/02/2016 -----------------------------------------    I have reviewed the triage vital signs and the nursing notes.   HISTORY  Chief Complaint Chest Pain    HPI Gregory [name] is a 83 y.o. male who presents to the ER for chest pain shortness of breath today. Patient was brought by his son for same. Son states he began complaining of chest pain since this morning has had increased weakness over the past several days. He reports a history of open heart surgery as well as pacemaker placement. Patient is a difficult historian, family is requesting admission for observation regarding chest pain.   Past Medical History:  Diagnosis Date  . BPH (benign prostatic hyperplasia)   . Chronic systolic CHF (congestive heart failure) (HCC)    EF of 45% with diastolic dysfunction, grade I  . Coronary artery disease    s/p CABG in 2008 and a recent catheterization which showed no new occlusion   . Dementia   . History of anemia    secondary to multiple AVMs  . Hyperlipidemia   . Hypertension   . Obstructive sleep apnea     Patient Active Problem List   Diagnosis Date Noted  . Goals of care, counseling/discussion   . Palliative care encounter   . COPD exacerbation (HCC) 11/15/2016  . Sick sinus syndrome (HCC) 10/14/2015  . History of permanent cardiac pacemaker placement 10/14/2015  . Thrombocytopenia (HCC) 10/14/2015  . Syncope 10/12/2015  . Chronic combined systolic and diastolic CHF (congestive heart failure) (HCC) 10/08/2015  . Tachycardia 10/08/2015  . Atrial fibrillation with RVR (HCC) 10/08/2015  . Hypotension 10/08/2015  . Leukopenia 10/08/2015  . Near syncope 10/05/2015  . Arrhythmia 10/05/2015  . CAD (coronary artery disease) 10/05/2015  . HTN (hypertension)  10/05/2015  . HLD (hyperlipidemia) 10/05/2015  . BPH (benign prostatic hyperplasia) 10/05/2015    Past Surgical History:  Procedure Laterality Date  . CORONARY ARTERY BYPASS GRAFT  2008   x2, negative cath about 8 months ago  . HEMORRHOID SURGERY    . INSERT / REPLACE / REMOVE PACEMAKER    . REFRACTIVE SURGERY      Allergies Cortisone and Penicillins  Social History Social History  Substance Use Topics  . Smoking status: Former Smoker    Types: Cigarettes    Quit date: 11/20/1982  . Smokeless tobacco: Never Used  . Alcohol use No    Review of Systems Constitutional: Negative for fever. Cardiovascular: Positive for chest pain Respiratory: Positive for shortness of breath  10-point ROS otherwise negative.  ____________________________________________   PHYSICAL EXAM:  VITAL SIGNS: ED Triage Vitals  Enc Vitals Group     BP 12/02/16 1447 (!) 96/47     Pulse Rate 12/02/16 1447 78     Resp 12/02/16 1447 18     Temp 12/02/16 1447 98.1 F (36.7 C)     Temp Source 12/02/16 1447 Oral     SpO2 12/02/16 1447 98 %     Weight 12/02/16 1448 140 lb (63.5 kg)     Height 12/02/16 1448 5\' 5"  (1.651 m)     Head Circumference --      Peak Flow --      Pain Score 12/02/16 1448 7     Pain Loc --      Pain Edu? --  Excl. in GC? --     Constitutional: Alert But disoriented, no distress Eyes: Conjunctivae are normal. Normal extraocular movements. ENT   Head: Normocephalic and atraumatic.   Nose: No congestion/rhinnorhea.   Mouth/Throat: Mucous membranes are moist.   Neck: No stridor. Cardiovascular: Normal rate, regular rhythm. No murmurs, rubs, or gallops. Respiratory: Normal respiratory effort without tachypnea nor retractions. Breath sounds are clear and equal bilaterally. No wheezes/rales/rhonchi. Gastrointestinal: Soft and nontender. Normal bowel sounds Musculoskeletal: Nontender with normal range of motion in all extremities. No lower extremity  tenderness nor edema. Neurologic:  Normal speech and language. No gross focal neurologic deficits are appreciated.  Skin:  Skin is warm, dry and intact. No rash noted. Psychiatric: Mood and affect are normal. Speech and behavior are normal.  ____________________________________________  EKG: Interpreted by me. AV dual pacemaker with a rate of 70 bpm  ____________________________________________  ED COURSE:  Pertinent labs & imaging results that were available during my care of the patient were reviewed by me and considered in my medical decision making (see chart for details). Clinical Course   Patient is in no acute distress, we will assess with labs and imaging.  Procedures ____________________________________________   LABS (pertinent positives/negatives)  Labs Reviewed  BASIC METABOLIC PANEL - Abnormal; Notable for the following:       Result Value   Potassium 3.3 (*)    Calcium 8.3 (*)    All other components within normal limits  CBC - Abnormal; Notable for the following:    RBC 4.38 (*)    Hemoglobin 12.7 (*)    HCT 38.0 (*)    RDW 16.2 (*)    Platelets 140 (*)    All other components within normal limits  TROPONIN I - Abnormal; Notable for the following:    Troponin I 0.04 (*)    All other components within normal limits  URINALYSIS, COMPLETE (UACMP) WITH MICROSCOPIC    RADIOLOGY  Chest x-ray IMPRESSION: No acute abnormality. Stable cardiomegaly, interstitial fibrosis and aortic atherosclerosis.   ____________________________________________  FINAL ASSESSMENT AND PLAN  Chest pain  Plan: Patient with labs and imaging as dictated above. Patient presents to the ER for chest pain with a slightly elevated troponin. Family are adamant that he be admitted for observation and serial troponin trends. I will discuss with the hospitalist for admission.   Emily FilbertWilliams, Masae Lukacs E, MD   Note: This dictation was prepared with Dragon dictation. Any transcriptional  errors that result from this process are unintentional    Emily FilbertJonathan E Shakiah Wester, MD 12/02/16 [id number]

## 2016-12-02 NOTE — H&P (Addendum)
[facility] -  at [facility]   PATIENT NAME: Gregory [name]    MR#:  [id number]  DATE OF BIRTH:  03/06/1931  DATE OF ADMISSION:  12/02/2016  PRIMARY CARE PHYSICIAN: Leanna SatoMILES,LINDA M, MD   REQUESTING/REFERRING PHYSICIAN: Emily FilbertJonathan E Williams, MD  CHIEF COMPLAINT:   Chief Complaint  Patient presents with  . Chest Pain    HISTORY OF PRESENT ILLNESS:  Gregory [name]  is a 83 y.o. male with a known history of Hypertension, chronic systolic CHF, CAD and dementia. The patient was sent from home to ED due to chest pain today. The patient is demented and unable to provide any information. Per his son, the patient complains of chest pain since this morning and generalized weakness for several days. The patient's troponin is elevated at 0.04. He was given aspirin 325 mg 1 dose in the ED. ED physician requested observation. The patient was taken off aspirin by his cardiologist due to anemia for 2 years.  PAST MEDICAL HISTORY:   Past Medical History:  Diagnosis Date  . BPH (benign prostatic hyperplasia)   . Chronic systolic CHF (congestive heart failure) (HCC)    EF of 45% with diastolic dysfunction, grade I  . Coronary artery disease    s/p CABG in 2008 and a recent catheterization which showed no new occlusion   . Dementia   . History of anemia    secondary to multiple AVMs  . Hyperlipidemia   . Hypertension   . Obstructive sleep apnea     PAST SURGICAL HISTORY:   Past Surgical History:  Procedure Laterality Date  . CORONARY ARTERY BYPASS GRAFT  2008   x2, negative cath about 8 months ago  . HEMORRHOID SURGERY    . INSERT / REPLACE / REMOVE PACEMAKER    . REFRACTIVE SURGERY      SOCIAL HISTORY:   Social History  Substance Use Topics  . Smoking status: Former Smoker    Types: Cigarettes    Quit date: 11/20/1982  . Smokeless tobacco: Never Used  . Alcohol use No    FAMILY HISTORY:   Family History  Problem Relation Age of Onset  . Cancer  Mother     gastric  . Coronary artery disease Sister   . Lung cancer Brother     DRUG ALLERGIES:   Allergies  Allergen Reactions  . Cortisone Hives  . Penicillins Hives and Other (See Comments)    Unable to obtain enough information to answer additional questions about this medication.      REVIEW OF SYSTEMS:   Review of Systems  Unable to perform ROS: Dementia    MEDICATIONS AT HOME:   Prior to Admission medications   Medication Sig Start Date End Date Taking? Authorizing Provider  allopurinol (ZYLOPRIM) 100 MG tablet Take 100 mg by mouth daily.   Yes Historical Provider, MD  amiodarone (PACERONE) 200 MG tablet Take 200 mg by mouth daily.   Yes Historical Provider, MD  donepezil (ARICEPT) 10 MG tablet Take 10 mg by mouth at bedtime.    Yes Historical Provider, MD  furosemide (LASIX) 20 MG tablet Take 20 mg by mouth.   Yes Historical Provider, MD  metoprolol tartrate (LOPRESSOR) 25 MG tablet Take 1 tablet (25 mg total) by mouth 2 (two) times daily. 10/08/15  Yes Katharina Caperima Vaickute, MD  omeprazole (PRILOSEC) 20 MG capsule Take 20 mg by mouth daily.   Yes Historical Provider, MD  potassium chloride (K-DUR,KLOR-CON) 10 MEQ tablet Take 10 mEq by  mouth daily.   Yes Historical Provider, MD  predniSONE (STERAPRED UNI-PAK 21 TAB) 10 MG (21) TBPK tablet Take 1 tablet (10 mg total) by mouth daily. start 60 mg by mouth daily, taper 10 mg daily until finished 11/17/16  Yes Vipul Sherryll Burger, MD  ramipril (ALTACE) 2.5 MG capsule Take 2.5 mg by mouth 2 (two) times daily.   Yes Historical Provider, MD  Tamsulosin HCl (FLOMAX) 0.4 MG CAPS Take 0.4 mg by mouth at bedtime.    Yes Historical Provider, MD  azithromycin (ZITHROMAX) 500 MG tablet Take 1 tablet (500 mg total) by mouth daily. 11/17/16   Delfino Lovett, MD      VITAL SIGNS:  Blood pressure (!) 95/41, pulse 78, temperature 98.1 F (36.7 C), temperature source Oral, resp. rate (!) 24, height 5\' 5"  (1.651 m), weight 140 lb (63.5 kg), SpO2 98  %.  PHYSICAL EXAMINATION:  Physical Exam  GENERAL:  83 y.o.-year-old patient lying in the bed with no acute distress.  EYES: Pupils equal, round, reactive to light and accommodation. No scleral icterus. Extraocular muscles intact.  HEENT: Head atraumatic, normocephalic. Dry oral mucosa.  NECK:  Supple, no jugular venous distention. No thyroid enlargement, no tenderness.  LUNGS: Normal breath sounds bilaterally, no wheezing, rales,rhonchi or crepitation. No use of accessory muscles of respiration.  CARDIOVASCULAR: S1, S2 normal. No murmurs, rubs, or gallops.  ABDOMEN: Soft, nontender, nondistended. Bowel sounds present. No organomegaly or mass.  EXTREMITIES: No pedal edema, cyanosis, or clubbing.  NEUROLOGIC: Unable to exam. PSYCHIATRIC: The patient is demented. SKIN: No obvious rash, lesion, or ulcer.   LABORATORY PANEL:   CBC  Recent Labs Lab 12/02/16 1453  WBC 5.5  HGB 12.7*  HCT 38.0*  PLT 140*   ------------------------------------------------------------------------------------------------------------------  Chemistries   Recent Labs Lab 12/02/16 1453  NA 135  K 3.3*  CL 101  CO2 25  GLUCOSE 79  BUN 17  CREATININE 0.94  CALCIUM 8.3*   ------------------------------------------------------------------------------------------------------------------  Cardiac Enzymes  Recent Labs Lab 12/02/16 1453  TROPONINI 0.04*   ------------------------------------------------------------------------------------------------------------------  RADIOLOGY:  Dg Chest 2 View  Result Date: 12/02/2016 CLINICAL DATA:  Chest pain and shortness breath. Weakness over the past several days. EXAM: CHEST  2 VIEW COMPARISON:  11/15/2016. FINDINGS: Stable enlarged cardiac silhouette, post CABG changes and aortic calcifications. Stable prominence of the interstitial markings. Normal vascularity. No pleural fluid. Thoracic spine degenerative changes and cervical spine fixation hardware.  IMPRESSION: No acute abnormality. Stable cardiomegaly, interstitial fibrosis and aortic atherosclerosis. Electronically Signed   By: Beckie Salts M.D.   On: 12/02/2016 15:14      IMPRESSION AND PLAN:   Chest pain with elevated troponin, rule out ACS. The patient will be placed for observation. Start aspirin 325 mg by mouth daily, follow-up troponin level and a cardiology consult.  Hypokalemia. Give potassium supplement and follow-up BMP and magnesium level.  Hypertension. Hold hypertension medication due to low side blood pressure. Dementia. Fall precaution and aspiration precaution.  Chronic systolic and diastolic CHF. Stable. Hold Lasix due to low pressure.  All the records are reviewed and case discussed with ED provider. Management plans discussed with the patient's 2 sons and they are in agreement.  CODE STATUS: DO NOT RESUSCITATE  TOTAL TIME TAKING CARE OF THIS PATIENT: 45 minutes.    Shaune Pollack M.D on 12/02/2016 at 7:15 PM  Between 7am to 6pm - Pager - [phone]  After 6pm go to [email or web address] - Social research officer, government  [employer]  [phone]  CC: Primary care physician; Leanna Sato, MD   Note: This dictation was prepared with Dragon dictation along with smaller phrase technology. Any transcriptional errors that result from this process are unintentional.

## 2016-12-02 NOTE — ED Triage Notes (Signed)
Patient presents to the ED with chest pain and shortness of breath by his son from home.  Patient's son states patient has been complaining of chest pain since this morning and has had increased weakness over the past several days.  Patient has history of multiple open heart surgeries and has a pacemaker.  Patient is in no obvious distress at this time.  Patient himself is a poor historian stating that he has had this chest pain for years and he is unable to describe it.

## 2016-12-03 LAB — TROPONIN I: Troponin I: 0.03 ng/mL (ref <0.03)

## 2016-12-03 MED ORDER — CEPASTAT 14.5 MG MT LOZG: 1.0000 | LOZENGE | OROMUCOSAL | 0 refills | Status: AC | PRN

## 2016-12-03 MED ORDER — ASPIRIN EC 81 MG PO TBEC: 81.0000 mg | DELAYED_RELEASE_TABLET | Freq: Every day | ORAL | 0 refills | Status: AC

## 2016-12-03 NOTE — Consult Note (Signed)
[facility] Consultation [facility]  Patient ID: Gregory LoaWilliam A [name], MRN: [id number], DOB/AGE: 81/17/1932 81 y.o. Admit date: 12/02/2016   Date of Consult: 12/03/2016 Primary Physician: Leanna SatoMILES,LINDA M, MD Primary Cardiologist: [facility]  Chief Complaint:  Chief Complaint  Patient presents with  . Chest Pain   Reason for Consult: shortness of breath  HPI: 83 y.o. male with known coronary artery disease status post coronary artery bypass graft with history of peripheral vascular disease and stroke essential hypertension makes hyperlipidemia and sleep apnea with significant dementia. The patient has been on appropriate medication management for these issues although has multiple admissions for significant progression shortness of breath and acute on chronic systolic dysfunction congestive heart failure. The patient has had worsening shortness of breath weakness but no evidence of chest discomfort on admission. EKG has shown of paced rhythm and troponin level is 0.03 more consistent with demand ischemia rather than acute coronary syndrome. The patient has been managed with atrial fibrillation with amiodarone and maintaining normal sinus rhythm at this time. There has been no anticoagulation likely secondary to compliance issues as well as concerns of fall risk and dementia. Currently the patient does have some improvements of symptoms and complaints of some mild shortness of breath.  Past Medical History:  Diagnosis Date  . BPH (benign prostatic hyperplasia)   . Chronic systolic CHF (congestive heart failure) (HCC)    EF of 45% with diastolic dysfunction, grade I  . Coronary artery disease    s/p CABG in 2008 and a recent catheterization which showed no new occlusion   . Dementia   . History of anemia    secondary to multiple AVMs  . Hyperlipidemia   . Hypertension   . Obstructive sleep apnea       Surgical History:  Past Surgical History:  Procedure Laterality Date  . CORONARY  ARTERY BYPASS GRAFT  2008   x2, negative cath about 8 months ago  . HEMORRHOID SURGERY    . INSERT / REPLACE / REMOVE PACEMAKER    . REFRACTIVE SURGERY       Home Meds: Prior to Admission medications   Medication Sig Start Date End Date Taking? Authorizing Provider  allopurinol (ZYLOPRIM) 100 MG tablet Take 100 mg by mouth daily.   Yes Historical Provider, MD  amiodarone (PACERONE) 200 MG tablet Take 200 mg by mouth daily.   Yes Historical Provider, MD  donepezil (ARICEPT) 10 MG tablet Take 10 mg by mouth at bedtime.    Yes Historical Provider, MD  furosemide (LASIX) 20 MG tablet Take 20 mg by mouth.   Yes Historical Provider, MD  metoprolol tartrate (LOPRESSOR) 25 MG tablet Take 1 tablet (25 mg total) by mouth 2 (two) times daily. 10/08/15  Yes Katharina Caperima Vaickute, MD  omeprazole (PRILOSEC) 20 MG capsule Take 20 mg by mouth daily.   Yes Historical Provider, MD  potassium chloride (K-DUR,KLOR-CON) 10 MEQ tablet Take 10 mEq by mouth daily.   Yes Historical Provider, MD  predniSONE (STERAPRED UNI-PAK 21 TAB) 10 MG (21) TBPK tablet Take 1 tablet (10 mg total) by mouth daily. start 60 mg by mouth daily, taper 10 mg daily until finished 11/17/16  Yes Vipul Sherryll BurgerShah, MD  ramipril (ALTACE) 2.5 MG capsule Take 2.5 mg by mouth 2 (two) times daily.   Yes Historical Provider, MD  Tamsulosin HCl (FLOMAX) 0.4 MG CAPS Take 0.4 mg by mouth at bedtime.    Yes Historical Provider, MD  azithromycin (ZITHROMAX) 500 MG tablet Take 1 tablet (500  mg total) by mouth daily. 11/17/16   Delfino Lovett, MD    Inpatient Medications:  . allopurinol  100 mg Oral Daily  . amiodarone  200 mg Oral Daily  . aspirin EC  325 mg Oral Daily  . donepezil  10 mg Oral QHS  . enoxaparin (LOVENOX) injection  40 mg Subcutaneous Q24H  . pantoprazole  40 mg Oral Daily  . tamsulosin  0.4 mg Oral QHS     Allergies:  Allergies  Allergen Reactions  . Cortisone Hives  . Penicillins Hives and Other (See Comments)    Unable to obtain enough  information to answer additional questions about this medication.      Social History   Social History  . Marital status: Widowed    Spouse name: N/A  . Number of children: N/A  . Years of education: N/A   Occupational History  . Not on file.   Social History Main Topics  . Smoking status: Former Smoker    Types: Cigarettes    Quit date: 11/20/1982  . Smokeless tobacco: Never Used  . Alcohol use No  . Drug use: No  . Sexual activity: No   Other Topics Concern  . Not on file   Social History Narrative  . No narrative on file     Family History  Problem Relation Age of Onset  . Cancer Mother     gastric  . Coronary artery disease Sister   . Lung cancer Brother      Review of Systems Positive for Shortness of breath Negative for: General:  chills, fever, night sweats or weight changes.  Cardiovascular: PND orthopnea syncope dizziness  Dermatological skin lesions rashes Respiratory: Cough congestion Urologic: Frequent urination urination at night and hematuria Abdominal: negative for nausea, vomiting, diarrhea, bright red blood per rectum, melena, or hematemesis Neurologic: negative for visual changes, and/or hearing changes  All other systems reviewed and are otherwise negative except as noted above.  Labs:  Recent Labs  12/02/16 1453 12/02/16 2110 12/02/16 2340  TROPONINI 0.04* 0.03* 0.03*   Lab Results  Component Value Date   WBC 5.5 12/02/2016   HGB 12.7 (L) 12/02/2016   HCT 38.0 (L) 12/02/2016   MCV 86.8 12/02/2016   PLT 140 (L) 12/02/2016    Recent Labs Lab 12/02/16 1453  NA 135  K 3.3*  CL 101  CO2 25  BUN 17  CREATININE 0.94  CALCIUM 8.3*  GLUCOSE 79   Lab Results  Component Value Date   CHOL 114 08/05/2012   HDL 13 (L) 08/05/2012   LDLCALC 72 08/05/2012   TRIG 143 08/05/2012   No results found for: DDIMER  Radiology/Studies:  Dg Chest 2 View  Result Date: 12/02/2016 CLINICAL DATA:  Chest pain and shortness breath. Weakness  over the past several days. EXAM: CHEST  2 VIEW COMPARISON:  11/15/2016. FINDINGS: Stable enlarged cardiac silhouette, post CABG changes and aortic calcifications. Stable prominence of the interstitial markings. Normal vascularity. No pleural fluid. Thoracic spine degenerative changes and cervical spine fixation hardware. IMPRESSION: No acute abnormality. Stable cardiomegaly, interstitial fibrosis and aortic atherosclerosis. Electronically Signed   By: Beckie Salts M.D.   On: 12/02/2016 15:14   Dg Chest 2 View  Result Date: 11/15/2016 CLINICAL DATA:  Shortness of breath, cough, congestion EXAM: CHEST  2 VIEW COMPARISON:  07/25/2016 FINDINGS: Prior CABG. Left pacer remains in place, unchanged. Chronic density noted at the left base/ lingula, likely scarring. No confluent opacity on the right. No effusions or  acute bony abnormality. IMPRESSION: Probable scarring in the left base/lingula. Cardiomegaly. No definite acute process. Electronically Signed   By: Charlett Nose M.D.   On: 11/15/2016 18:26    EKG: Paced rhythm  Weights: Filed Weights   12/02/16 1448 12/02/16 2106  Weight: 63.5 kg (140 lb) 65.2 kg (143 lb 11.2 oz)     Physical Exam: Blood pressure (!) 104/43, pulse 67, temperature 97.7 F (36.5 C), temperature source Oral, resp. rate 18, height 5\' 5"  (1.651 m), weight 65.2 kg (143 lb 11.2 oz), SpO2 94 %. Body mass index is 23.91 kg/m. General: Well developed, well nourished, in no acute distress. Head eyes ears nose throat: Normocephalic, atraumatic, sclera non-icteric, no xanthomas, nares are without discharge. No apparent thyromegaly and/or mass  Lungs: Normal respiratory effort.  no wheezes, few rales, some rhonchi.  Heart: RRR with normal S1 S2. 23+ left upper sternal border murmur gallop, no rub, PMI is normal size and placement, carotid upstroke normal without bruit, jugular venous pressure is normal Abdomen: Soft, non-tender, non-distended with normoactive bowel sounds. No  hepatomegaly. No rebound/guarding. No obvious abdominal masses. Abdominal aorta is normal size without bruit Extremities: Trace edema. no cyanosis, no clubbing, no ulcers  Peripheral : 2+ bilateral upper extremity pulses, 2+ bilateral femoral pulses, 2+ bilateral dorsal pedal pulse Neuro: Not Alert and oriented. No facial asymmetry. No focal deficit. Moves all extremities spontaneously. Musculoskeletal: Normal muscle tone without kyphosis Psych:  Responds to questions appropriately with a normal affect.    Assessment: 83 year old male with known coronary artery disease status post coronary bypass graft vascular dementia essential hypertension mixed hyperlipidemia and atrial fibrillation currently in normal sinus rhythm with acute on chronic systolic dysfunction heart failure without evidence of myocardial infarction  Plan: 1. Furosemide for fluid balances and pulmonary edema as necessary watching closely for chronic kidney disease 2. Continue amiodarone for maintenance of normal sinus rhythm 3. No anticoagulation at this time due to concerns of dementia fall risk and compliance issues unless further evaluated by primary care and/or primary cardiologist 4. Begin ambulation and follow for improvements of symptoms of and consider additional medication management if necessary 5. No further cardiac diagnostics necessary at this time on  6. Possible discharge to outside facility for further care and reduction of possible rehospitalization.  Signed, Lamar Blinks M.D. [facility] 12/03/2016, 6:05 AM

## 2016-12-03 NOTE — Progress Notes (Signed)
Patient given discharge teaching and paperwork regarding medications, diet, follow-up appointments and activity. Patient understanding verbalized. No complaints at this time. IV and telemetry discontinued prior to leaving. Skin assessment as previously charted and vitals are stable; on room air. Patient being discharged to home. Family present during discharge teaching.  Prescription handed to son.

## 2016-12-03 NOTE — Progress Notes (Signed)
Pt. Slept well throughout the night with no c/o pain, SOB or acute distress noted. Alert only to himself. HOH.

## 2016-12-03 NOTE — Care Management Obs Status (Signed)
MEDICARE OBSERVATION STATUS NOTIFICATION   Patient Details  Name: Gregory LoaWilliam A Bean MRN: [id number] Date of Birth: 03/06/1931   Medicare Observation Status Notification Given:  No (discharge order in less than 24 hours)    Caren MacadamMichelle Trynity Skousen, RN 12/03/2016, 1:00 PM

## 2016-12-03 NOTE — Plan of Care (Signed)
Problem: Pain Managment: Goal: General experience of comfort will improve Outcome: Completed/Met Date Met: 12/03/16 No complaints of chest pain

## 2016-12-12 NOTE — Discharge Summary (Signed)
[facility] [place] [facility]   PATIENT NAME: Gregory [name]    MR#:  [id number]  DATE OF BIRTH:  03/06/1931  DATE OF ADMISSION:  12/02/2016 ADMITTING PHYSICIAN: Shaune PollackQing Chen, MD  DATE OF DISCHARGE: 12/03/2016  2:13 PM  PRIMARY CARE PHYSICIAN: Leanna SatoMILES,LINDA M, MD    ADMISSION DIAGNOSIS:  Elevated troponin I level [R74.8] Nonspecific chest pain [R07.9]  DISCHARGE DIAGNOSIS:  Active Problems:   Chest pain   SECONDARY DIAGNOSIS:   Past Medical History:  Diagnosis Date  . BPH (benign prostatic hyperplasia)   . Chronic systolic CHF (congestive heart failure) (HCC)    EF of 45% with diastolic dysfunction, grade I  . Coronary artery disease    s/p CABG in 2008 and a recent catheterization which showed no new occlusion   . Dementia   . History of anemia    secondary to multiple AVMs  . Hyperlipidemia   . Hypertension   . Obstructive sleep apnea     HOSPITAL COURSE:   Chest pain with elevated troponin, rule out ACS. The patient was placed for observation on telemetry. Start aspirin 325 mg by mouth daily, follow-up troponin level remained stable and a cardiology consult was done- he suggested no further work ups.  Hypokalemia. Give potassium supplement and follow-up BMP and magnesium level.  Hypertension. Hold hypertension medication due to low side blood pressure. Stable later on next day. Dementia. Fall precaution and aspiration precaution.  Chronic systolic and diastolic CHF. Stable. Held Lasix due to low pressure. Stable on discharge.  DISCHARGE CONDITIONS:   Stable.  CONSULTS OBTAINED:  Treatment Team:  Lamar BlinksBruce J Kowalski, MD  DRUG ALLERGIES:   Allergies  Allergen Reactions  . Cortisone Hives  . Penicillins Hives and Other (See Comments)    Unable to obtain enough information to answer additional questions about this medication.      DISCHARGE MEDICATIONS:   Discharge Medication List as of 12/03/2016  1:08 PM    START taking  these medications   Details  aspirin EC 81 MG tablet Take 1 tablet (81 mg total) by mouth daily., Starting Mon 12/04/2016, Print    phenol-menthol (CEPASTAT) 14.5 MG lozenge Place 1 lozenge inside cheek as needed for sore throat., Starting Sun 12/03/2016, Normal      CONTINUE these medications which have NOT CHANGED   Details  allopurinol (ZYLOPRIM) 100 MG tablet Take 100 mg by mouth daily., Historical Med    amiodarone (PACERONE) 200 MG tablet Take 200 mg by mouth daily., Historical Med    donepezil (ARICEPT) 10 MG tablet Take 10 mg by mouth at bedtime. , Historical Med    furosemide (LASIX) 20 MG tablet Take 20 mg by mouth., Historical Med    omeprazole (PRILOSEC) 20 MG capsule Take 20 mg by mouth daily., Historical Med    potassium chloride (K-DUR,KLOR-CON) 10 MEQ tablet Take 10 mEq by mouth daily., Historical Med    predniSONE (STERAPRED UNI-PAK 21 TAB) 10 MG (21) TBPK tablet Take 1 tablet (10 mg total) by mouth daily. start 60 mg by mouth daily, taper 10 mg daily until finished, Starting Fri 11/17/2016, Normal    Tamsulosin HCl (FLOMAX) 0.4 MG CAPS Take 0.4 mg by mouth at bedtime. , Historical Med      STOP taking these medications     azithromycin (ZITHROMAX) 500 MG tablet      metoprolol tartrate (LOPRESSOR) 25 MG tablet      ramipril (ALTACE) 2.5 MG capsule  DISCHARGE INSTRUCTIONS:    Follow with clinic in 1-2 weeks.  If you experience worsening of your admission symptoms, develop shortness of breath, life threatening emergency, suicidal or homicidal thoughts you must seek medical attention immediately by calling 911 or calling your MD immediately  if symptoms less severe.  You Must read complete instructions/literature along with all the possible adverse reactions/side effects for all the Medicines you take and that have been prescribed to you. Take any new Medicines after you have completely understood and accept all the possible adverse reactions/side  effects.   Please note  You were cared for by a hospitalist during your hospital stay. If you have any questions about your discharge medications or the care you received while you were in the hospital after you are discharged, you can call the unit and asked to speak with the hospitalist on call if the hospitalist that took care of you is not available. Once you are discharged, your primary care physician will handle any further medical issues. Please note that NO REFILLS for any discharge medications will be authorized once you are discharged, as it is imperative that you return to your primary care physician (or establish a relationship with a primary care physician if you do not have one) for your aftercare needs so that they can reassess your need for medications and monitor your lab values.    Today   CHIEF COMPLAINT:   Chief Complaint  Patient presents with  . Chest Pain    HISTORY OF PRESENT ILLNESS:  Gregory [name]  is a 82 y.o. male with a known history of Hypertension, chronic systolic CHF, CAD and dementia. The patient was sent from home to ED due to chest pain today. The patient is demented and unable to provide any information. Per his son, the patient complains of chest pain since this morning and generalized weakness for several days. The patient's troponin is elevated at 0.04. He was given aspirin 325 mg 1 dose in the ED. ED physician requested observation. The patient was taken off aspirin by his cardiologist due to anemia for 2 years.   VITAL SIGNS:  Blood pressure (!) 111/50, pulse 70, temperature 97.8 F (36.6 C), temperature source Oral, resp. rate 18, height 5\' 5"  (1.651 m), weight 65.2 kg (143 lb 11.2 oz), SpO2 95 %.  I/O:  No intake or output data in the 24 hours ending 12/12/16 0855  PHYSICAL EXAMINATION:   GENERAL:  83 y.o.-year-old patient lying in the bed with no acute distress.  EYES: Pupils equal, round, reactive to light and accommodation. No scleral  icterus. Extraocular muscles intact.  HEENT: Head atraumatic, normocephalic. Dry oral mucosa.  NECK:  Supple, no jugular venous distention. No thyroid enlargement, no tenderness.  LUNGS: Normal breath sounds bilaterally, no wheezing, rales,rhonchi or crepitation. No use of accessory muscles of respiration.  CARDIOVASCULAR: S1, S2 normal. No murmurs, rubs, or gallops.  ABDOMEN: Soft, nontender, nondistended. Bowel sounds present. No organomegaly or mass.  EXTREMITIES: No pedal edema, cyanosis, or clubbing.  NEUROLOGIC: Unable to exam. PSYCHIATRIC: The patient is demented.   DATA REVIEW:   CBC No results for input(s): WBC, HGB, HCT, PLT in the last 168 hours.  Chemistries  No results for input(s): NA, K, CL, CO2, GLUCOSE, BUN, CREATININE, CALCIUM, MG, AST, ALT, ALKPHOS, BILITOT in the last 168 hours.  Invalid input(s): GFRCGP  Cardiac Enzymes No results for input(s): TROPONINI in the last 168 hours.  Microbiology Results  Results for orders placed or performed during  the hospital encounter of 11/15/16  Blood culture (routine x 2)     Status: None   Collection Time: 11/15/16  8:14 PM  Result Value Ref Range Status   Specimen Description BLOOD L AC  Final   Special Requests BOTTLES DRAWN AEROBIC AND ANAEROBIC   Final   Culture NO GROWTH 5 DAYS  Final   Report Status 11/20/2016 FINAL  Final  Blood culture (routine x 2)     Status: None   Collection Time: 11/15/16  8:14 PM  Result Value Ref Range Status   Specimen Description BLOOD  R AC  Final   Special Requests BOTTLES DRAWN AEROBIC AND ANAEROBIC  ANA 9 AER  Final   Culture NO GROWTH 5 DAYS  Final   Report Status 11/20/2016 FINAL  Final    RADIOLOGY:  No results found.  EKG:   Orders placed or performed during the hospital encounter of 12/02/16  . EKG 12-Lead  . EKG 12-Lead  . ED EKG  . ED EKG  . EKG      Management plans discussed with the patient, family and they are in agreement.  CODE STATUS:  Code  Status History    Date Active Date Inactive Code Status Order ID Comments User Context   12/02/2016  9:04 PM 12/03/2016  5:18 PM DNR [id number]  Shaune Pollack, MD Inpatient   11/16/2016  3:03 PM 11/17/2016  7:17 PM DNR [id number]  Delfino Lovett, MD Inpatient   11/15/2016 10:46 PM 11/16/2016  3:03 PM Full Code [id number]  Oralia Manis, MD Inpatient   10/12/2015  5:58 PM 10/14/2015  5:56 PM Full Code [id number]  Enedina Finner, MD Inpatient   10/06/2015 12:09 AM 10/08/2015  3:10 PM Full Code [id number]  Oralia Manis, MD ED    Questions for Most Recent Historical Code Status (Order [id number])    Question Answer Comment   In the event of cardiac or respiratory ARREST Do not call a "code blue"    In the event of cardiac or respiratory ARREST Do not perform Intubation, CPR, defibrillation or ACLS    In the event of cardiac or respiratory ARREST Use medication by any route, position, wound care, and other measures to relive pain and suffering. May use oxygen, suction and manual treatment of airway obstruction as needed for comfort.         Advance Directive Documentation   Flowsheet Row Most Recent Value  Type of Advance Directive  Living will  Pre-existing out of facility DNR order (yellow form or pink MOST form)  No data  "MOST" Form in Place?  No data      TOTAL TIME TAKING CARE OF THIS PATIENT: 35 minutes.    Altamese Dilling M.D on 12/12/2016 at 8:55 AM  Between 7am to 6pm - Pager - [phone]  After 6pm go to [email or web address] - password [email or web address]  [facility]  Office  [phone]  CC: Primary care physician; Leanna Sato, MD   Note: This dictation was prepared with Dragon dictation along with smaller phrase technology. Any transcriptional errors that result from this process are unintentional.

## 2016-12-18 NOTE — Progress Notes (Signed)
New PT note to include G-codes.  Encarnacion ChuAshley Abashian PT, DPT    11/17/16 1440  PT G-Codes **NOT FOR INPATIENT CLASS**  Functional Assessment Tool Used Clinical Judgement  Functional Limitation Mobility: Walking and moving around  Mobility: Walking and Moving Around Current Status (V4098(G8978) CI  Mobility: Walking and Moving Around Goal Status [phone](G8979) CI

## 2017-03-02 ENCOUNTER — Emergency Department: Payer: Medicare Other

## 2017-03-02 ENCOUNTER — Emergency Department
Admission: EM | Admit: 2017-03-02 | Discharge: 2017-03-02 | Disposition: A | Discharge status: Home / Self Care | Payer: Medicare Other | Attending: Student in an Organized Health Care Education/Training Program | Admitting: Student in an Organized Health Care Education/Training Program

## 2017-03-02 DIAGNOSIS — Z87891 Personal history of nicotine dependence: Secondary | ICD-10-CM | POA: Diagnosis not present

## 2017-03-02 DIAGNOSIS — I5042 Chronic combined systolic (congestive) and diastolic (congestive) heart failure: Secondary | ICD-10-CM | POA: Insufficient documentation

## 2017-03-02 DIAGNOSIS — Y999 Unspecified external cause status: Secondary | ICD-10-CM | POA: Insufficient documentation

## 2017-03-02 DIAGNOSIS — W1839XA Other fall on same level, initial encounter: Secondary | ICD-10-CM | POA: Diagnosis not present

## 2017-03-02 DIAGNOSIS — Y939 Activity, unspecified: Secondary | ICD-10-CM | POA: Diagnosis not present

## 2017-03-02 DIAGNOSIS — J441 Chronic obstructive pulmonary disease with (acute) exacerbation: Secondary | ICD-10-CM | POA: Diagnosis not present

## 2017-03-02 DIAGNOSIS — S20219A Contusion of unspecified front wall of thorax, initial encounter: Secondary | ICD-10-CM | POA: Insufficient documentation

## 2017-03-02 DIAGNOSIS — I110 Hypertensive heart disease with heart failure: Secondary | ICD-10-CM | POA: Insufficient documentation

## 2017-03-02 DIAGNOSIS — S299XXA Unspecified injury of thorax, initial encounter: Secondary | ICD-10-CM | POA: Diagnosis present

## 2017-03-02 DIAGNOSIS — I2510 Atherosclerotic heart disease of native coronary artery without angina pectoris: Secondary | ICD-10-CM | POA: Insufficient documentation

## 2017-03-02 DIAGNOSIS — Y929 Unspecified place or not applicable: Secondary | ICD-10-CM | POA: Insufficient documentation

## 2017-03-02 DIAGNOSIS — R58 Hemorrhage, not elsewhere classified: Secondary | ICD-10-CM

## 2017-03-02 LAB — CBC WITH DIFFERENTIAL/PLATELET
Basophils Absolute: 0 10*3/uL (ref 0–0.1)
Basophils Relative: 1 %
Eosinophils Absolute: 0.1 10*3/uL (ref 0–0.7)
Eosinophils Relative: 1 %
HEMATOCRIT: 27.8 % — AB (ref 40.0–52.0)
HEMOGLOBIN: 9 g/dL — AB (ref 13.0–18.0)
LYMPHS PCT: 20 %
Lymphs Abs: 0.9 10*3/uL — ABNORMAL LOW (ref 1.0–3.6)
MCH: 30.4 pg (ref 26.0–34.0)
MCHC: 32.6 g/dL (ref 32.0–36.0)
MCV: 93.2 fL (ref 80.0–100.0)
MONOS PCT: 12 %
Monocytes Absolute: 0.6 10*3/uL (ref 0.2–1.0)
NEUTROS ABS: 3 10*3/uL (ref 1.4–6.5)
NEUTROS PCT: 66 %
Platelets: 142 10*3/uL — ABNORMAL LOW (ref 150–440)
RBC: 2.98 MIL/uL — AB (ref 4.40–5.90)
RDW: 18 % — ABNORMAL HIGH (ref 11.5–14.5)
WBC: 4.6 10*3/uL (ref 3.8–10.6)

## 2017-03-02 LAB — COMPREHENSIVE METABOLIC PANEL
ALBUMIN: 1.8 g/dL — AB (ref 3.5–5.0)
ALT: 20 U/L (ref 17–63)
ANION GAP: 5 (ref 5–15)
AST: 45 U/L — AB (ref 15–41)
Alkaline Phosphatase: 135 U/L — ABNORMAL HIGH (ref 38–126)
BUN: 19 mg/dL (ref 6–20)
CHLORIDE: 106 mmol/L (ref 101–111)
CO2: 27 mmol/L (ref 22–32)
Calcium: 8.3 mg/dL — ABNORMAL LOW (ref 8.9–10.3)
Creatinine, Ser: 1.09 mg/dL (ref 0.61–1.24)
GFR calc Af Amer: >60 mL/min (ref >60)
GLUCOSE: 94 mg/dL (ref 65–99)
POTASSIUM: 3.5 mmol/L (ref 3.5–5.1)
Sodium: 138 mmol/L (ref 135–145)
TOTAL PROTEIN: 6.1 g/dL — AB (ref 6.5–8.1)
Total Bilirubin: 0.6 mg/dL (ref 0.3–1.2)

## 2017-03-02 LAB — PROTIME-INR
INR: 1.21
PROTHROMBIN TIME: 15.4 s — AB (ref 11.4–15.2)

## 2017-03-02 NOTE — ED Triage Notes (Addendum)
Pt brought via ems from [facility] for c/o rash on chest that will not resolve - rash appears to be bruising in nature with some red pustules and open area - pt denies any pain or itching with area

## 2017-03-02 NOTE — ED Provider Notes (Signed)
[facility] Emergency Department Provider Note    First MD Initiated Contact with Patient 03/02/17 1403     (approximate)  I have reviewed the triage vital signs and the nursing notes.   HISTORY  Chief Complaint Rash    HPI Gregory [name] is a 83 y.o. male with a history of CHF and CAD status post CABG as well as dementia presents from [facility], for evaluation of rash to his anterior chest wall. Was first noted byfamily several days ago. Patient was apparently being helped out of bed by son and fell onto his son landing on his chest. After this fall is when the rash appeared. According to the sons, they've been putting hydrocortisone cream on the rash without any improvement. No fevers. Patient is otherwise acting at baseline. He denies any pain or discomfort. States he wants to go back home. Denies any chest pain or shortness of breath. No leg swelling.   Past Medical History:  Diagnosis Date  . BPH (benign prostatic hyperplasia)   . Chronic systolic CHF (congestive heart failure) (HCC)    EF of 45% with diastolic dysfunction, grade I  . Coronary artery disease    s/p CABG in 2008 and a recent catheterization which showed no new occlusion   . Dementia   . History of anemia    secondary to multiple AVMs  . Hyperlipidemia   . Hypertension   . Obstructive sleep apnea    Family History  Problem Relation Age of Onset  . Cancer Mother     gastric  . Coronary artery disease Sister   . Lung cancer Brother    Past Surgical History:  Procedure Laterality Date  . CORONARY ARTERY BYPASS GRAFT  2008   x2, negative cath about 8 months ago  . HEMORRHOID SURGERY    . INSERT / REPLACE / REMOVE PACEMAKER    . REFRACTIVE SURGERY     Patient Active Problem List   Diagnosis Date Noted  . Chest pain 12/02/2016  . Goals of care, counseling/discussion   . Palliative care encounter   . COPD exacerbation (HCC) 11/15/2016  . Sick sinus syndrome (HCC)  10/14/2015  . History of permanent cardiac pacemaker placement 10/14/2015  . Thrombocytopenia (HCC) 10/14/2015  . Syncope 10/12/2015  . Chronic combined systolic and diastolic CHF (congestive heart failure) (HCC) 10/08/2015  . Tachycardia 10/08/2015  . Atrial fibrillation with RVR (HCC) 10/08/2015  . Hypotension 10/08/2015  . Leukopenia 10/08/2015  . Near syncope 10/05/2015  . Arrhythmia 10/05/2015  . CAD (coronary artery disease) 10/05/2015  . HTN (hypertension) 10/05/2015  . HLD (hyperlipidemia) 10/05/2015  . BPH (benign prostatic hyperplasia) 10/05/2015      Prior to Admission medications   Medication Sig Start Date End Date Taking? Authorizing Provider  allopurinol (ZYLOPRIM) 100 MG tablet Take 100 mg by mouth daily.    Historical Provider, MD  amiodarone (PACERONE) 200 MG tablet Take 200 mg by mouth daily.    Historical Provider, MD  aspirin EC 81 MG tablet Take 1 tablet (81 mg total) by mouth daily. 12/04/16   Altamese Dilling, MD  donepezil (ARICEPT) 10 MG tablet Take 10 mg by mouth at bedtime.     Historical Provider, MD  furosemide (LASIX) 20 MG tablet Take 20 mg by mouth.    Historical Provider, MD  omeprazole (PRILOSEC) 20 MG capsule Take 20 mg by mouth daily.    Historical Provider, MD  phenol-menthol (CEPASTAT) 14.5 MG lozenge Place 1 lozenge inside  cheek as needed for sore throat. 12/03/16   Altamese Dilling, MD  potassium chloride (K-DUR,KLOR-CON) 10 MEQ tablet Take 10 mEq by mouth daily.    Historical Provider, MD  predniSONE (STERAPRED UNI-PAK 21 TAB) 10 MG (21) TBPK tablet Take 1 tablet (10 mg total) by mouth daily. start 60 mg by mouth daily, taper 10 mg daily until finished 11/17/16   Delfino Lovett, MD  Tamsulosin HCl (FLOMAX) 0.4 MG CAPS Take 0.4 mg by mouth at bedtime.     Historical Provider, MD    Allergies Cortisone and Penicillins    Social History Social History  Substance Use Topics  . Smoking status: Former Smoker    Types: Cigarettes     Quit date: 11/20/1982  . Smokeless tobacco: Never Used  . Alcohol use No    Review of Systems Patient denies headaches, rhinorrhea, blurry vision, numbness, shortness of breath, chest pain, edema, cough, abdominal pain, nausea, vomiting, diarrhea, dysuria, fevers, rashes or hallucinations unless otherwise stated above in HPI. ____________________________________________   PHYSICAL EXAM:  VITAL SIGNS: Vitals:   03/02/17 1402 03/02/17 1840  BP: 128/64 (!) 111/52  Pulse: 76 60  Resp: 17 18  Temp: 98 F (36.7 C)     Constitutional: Alert and oriented. Chronically ill appearing and in no acute distress. Head: Atraumatic. Nose: No congestion/rhinnorhea. Mouth/Throat: Mucous membranes are moist.  Oropharynx non-erythematous. Neck: No stridor. Painless ROM. No cervical spine tenderness to palpation Hematological/Lymphatic/Immunilogical: No cervical lymphadenopathy. Cardiovascular: Normal rate, regular rhythm. Grossly normal heart sounds.  Good peripheral circulation. Respiratory: Normal respiratory effort.  No retractions. Lungs CTAB. Gastrointestinal: Soft and nontender. No distention. No abdominal bruits. No CVA tenderness. Musculoskeletal: No lower extremity tenderness nor edema.  No joint effusions. Neurologic:  Normal speech and language. No gross focal neurologic deficits are appreciated. No gait instability. Skin:  Diffuse non blanching ecchymosis and contusion to anterior chest wall and anterior right shoulder with a few skin tears <1cm and hemostatic, no blisters, no crepitus Psychiatric: Mood and affect are normal. Speech and behavior are normal.  ____________________________________________   LABS (all labs ordered are listed, but only abnormal results are displayed)  Results for orders placed or performed during the hospital encounter of 03/02/17 (from the past 24 hour(s))  CBC with Differential/Platelet     Status: Abnormal   Collection Time: 03/02/17  2:40 PM  Result  Value Ref Range   WBC 4.6 3.8 - 10.6 K/uL   RBC 2.98 (L) 4.40 - 5.90 MIL/uL   Hemoglobin 9.0 (L) 13.0 - 18.0 g/dL   HCT 29.5 (L) 62.1 - 30.8 %   MCV 93.2 80.0 - 100.0 fL   MCH 30.4 26.0 - 34.0 pg   MCHC 32.6 32.0 - 36.0 g/dL   RDW 65.7 (H) 84.6 - 96.2 %   Platelets 142 (L) 150 - 440 K/uL   Neutrophils Relative % 66 %   Neutro Abs 3.0 1.4 - 6.5 K/uL   Lymphocytes Relative 20 %   Lymphs Abs 0.9 (L) 1.0 - 3.6 K/uL   Monocytes Relative 12 %   Monocytes Absolute 0.6 0.2 - 1.0 K/uL   Eosinophils Relative 1 %   Eosinophils Absolute 0.1 0 - 0.7 K/uL   Basophils Relative 1 %   Basophils Absolute 0.0 0 - 0.1 K/uL  Protime-INR     Status: Abnormal   Collection Time: 03/02/17  2:40 PM  Result Value Ref Range   Prothrombin Time 15.4 (H) 11.4 - 15.2 seconds   INR 1.21  Comprehensive metabolic panel     Status: Abnormal   Collection Time: 03/02/17  2:40 PM  Result Value Ref Range   Sodium 138 135 - 145 mmol/L   Potassium 3.5 3.5 - 5.1 mmol/L   Chloride 106 101 - 111 mmol/L   CO2 27 22 - 32 mmol/L   Glucose, Bld 94 65 - 99 mg/dL   BUN 19 6 - 20 mg/dL   Creatinine, Ser 1.61 0.61 - 1.24 mg/dL   Calcium 8.3 (L) 8.9 - 10.3 mg/dL   Total Protein 6.1 (L) 6.5 - 8.1 g/dL   Albumin 1.8 (L) 3.5 - 5.0 g/dL   AST 45 (H) 15 - 41 U/L   ALT 20 17 - 63 U/L   Alkaline Phosphatase 135 (H) 38 - 126 U/L   Total Bilirubin 0.6 0.3 - 1.2 mg/dL   GFR calc non Af Amer >60 >60 mL/min   GFR calc Af Amer >60 >60 mL/min   Anion gap 5 5 - 15   ____________________________________________   ____________________________________________  RADIOLOGY  I personally reviewed all radiographic images ordered to evaluate for the above acute complaints and reviewed radiology reports and findings.  These findings were personally discussed with the patient.  Please see medical record for radiology report.  ____________________________________________   PROCEDURES  Procedure(s) performed:   Procedures    Critical Care performed: no ____________________________________________   INITIAL IMPRESSION / ASSESSMENT AND PLAN / ED COURSE  Pertinent labs & imaging results that were available during my care of the patient were reviewed by me and considered in my medical decision making (see chart for details).  DDX: contusion, abrasion, petechia, purpura  Gregory [name] is a 83 y.o. who presents to the ED with concern for rash on his chest. Patient afebrile. Exam as above. Blood work ordered to evaluate for any evidence of thrombocytopenia or renal dysfunction. INR is at baseline. Rash does appear consistent with contusion. No crepitus. No evidence of rash. No evidence of SJ S. No oral pharyngeal lesions. Patient acting at baseline according to family. There is no evidence of head trauma.  Clinical Course as of Mar 02 2129  Fri Mar 02, 2017  1626 Chest x-ray with no evidence of a pulmonary contusion or pneumothorax. Patient remains he mechanically stable and appropriate for discharge home.  [PR]    Clinical Course User Index [PR] Willy Eddy, MD     ____________________________________________   FINAL CLINICAL IMPRESSION(S) / ED DIAGNOSES  Final diagnoses:  Ecchymosis      NEW MEDICATIONS STARTED DURING THIS VISIT:  Discharge Medication List as of 03/02/2017  4:27 PM       Note:  This document was prepared using Dragon voice recognition software and may include unintentional dictation errors.    Willy Eddy, MD 03/02/17 2131

## 2017-03-02 NOTE — Discharge Instructions (Signed)
Follow up with PCP.  Return for any fevers, bleeding, contusion

## 2017-03-02 NOTE — ED Notes (Addendum)
Pt brought via ems from [facility] for c/o rash on chest that will not resolve - rash appears to be bruising in nature with some red pustules and open areas - pt denies any pain or itching with area

## 2017-04-26 ENCOUNTER — Other Ambulatory Visit: Payer: Self-pay | Admitting: Gastroenterology

## 2017-04-26 DIAGNOSIS — D649 Anemia, unspecified: Secondary | ICD-10-CM

## 2017-05-08 ENCOUNTER — Inpatient Hospital Stay: Payer: Medicare Other

## 2017-05-08 ENCOUNTER — Inpatient Hospital Stay
Admission: EM | Admit: 2017-05-08 | Discharge: 2017-05-20 | DRG: 871 | Disposition: E | Payer: Medicare Other | Attending: Internal Medicine | Admitting: Internal Medicine

## 2017-05-08 ENCOUNTER — Emergency Department: Payer: Medicare Other

## 2017-05-08 ENCOUNTER — Ambulatory Visit: Admission: RE | Admit: 2017-05-08 | Payer: Medicare Other | Source: Ambulatory Visit

## 2017-05-08 DIAGNOSIS — Z8249 Family history of ischemic heart disease and other diseases of the circulatory system: Secondary | ICD-10-CM

## 2017-05-08 DIAGNOSIS — Z950 Presence of cardiac pacemaker: Secondary | ICD-10-CM

## 2017-05-08 DIAGNOSIS — Z66 Do not resuscitate: Secondary | ICD-10-CM | POA: Diagnosis present

## 2017-05-08 DIAGNOSIS — Z801 Family history of malignant neoplasm of trachea, bronchus and lung: Secondary | ICD-10-CM | POA: Diagnosis not present

## 2017-05-08 DIAGNOSIS — J9690 Respiratory failure, unspecified, unspecified whether with hypoxia or hypercapnia: Secondary | ICD-10-CM | POA: Diagnosis present

## 2017-05-08 DIAGNOSIS — F0390 Unspecified dementia without behavioral disturbance: Secondary | ICD-10-CM | POA: Diagnosis present

## 2017-05-08 DIAGNOSIS — Z951 Presence of aortocoronary bypass graft: Secondary | ICD-10-CM

## 2017-05-08 DIAGNOSIS — G4733 Obstructive sleep apnea (adult) (pediatric): Secondary | ICD-10-CM | POA: Diagnosis present

## 2017-05-08 DIAGNOSIS — Z789 Other specified health status: Secondary | ICD-10-CM

## 2017-05-08 DIAGNOSIS — Z87891 Personal history of nicotine dependence: Secondary | ICD-10-CM | POA: Diagnosis not present

## 2017-05-08 DIAGNOSIS — Z888 Allergy status to other drugs, medicaments and biological substances status: Secondary | ICD-10-CM

## 2017-05-08 DIAGNOSIS — M109 Gout, unspecified: Secondary | ICD-10-CM | POA: Diagnosis present

## 2017-05-08 DIAGNOSIS — E785 Hyperlipidemia, unspecified: Secondary | ICD-10-CM | POA: Diagnosis present

## 2017-05-08 DIAGNOSIS — R680 Hypothermia, not associated with low environmental temperature: Secondary | ICD-10-CM | POA: Diagnosis present

## 2017-05-08 DIAGNOSIS — Z800 Family history of malignant neoplasm of digestive organs: Secondary | ICD-10-CM

## 2017-05-08 DIAGNOSIS — N400 Enlarged prostate without lower urinary tract symptoms: Secondary | ICD-10-CM | POA: Diagnosis present

## 2017-05-08 DIAGNOSIS — N179 Acute kidney failure, unspecified: Secondary | ICD-10-CM | POA: Diagnosis present

## 2017-05-08 DIAGNOSIS — I5042 Chronic combined systolic (congestive) and diastolic (congestive) heart failure: Secondary | ICD-10-CM | POA: Diagnosis present

## 2017-05-08 DIAGNOSIS — R6521 Severe sepsis with septic shock: Secondary | ICD-10-CM

## 2017-05-08 DIAGNOSIS — I2510 Atherosclerotic heart disease of native coronary artery without angina pectoris: Secondary | ICD-10-CM | POA: Diagnosis present

## 2017-05-08 DIAGNOSIS — J189 Pneumonia, unspecified organism: Secondary | ICD-10-CM | POA: Diagnosis present

## 2017-05-08 DIAGNOSIS — I110 Hypertensive heart disease with heart failure: Secondary | ICD-10-CM | POA: Diagnosis present

## 2017-05-08 DIAGNOSIS — I4891 Unspecified atrial fibrillation: Secondary | ICD-10-CM | POA: Diagnosis present

## 2017-05-08 DIAGNOSIS — Z7982 Long term (current) use of aspirin: Secondary | ICD-10-CM

## 2017-05-08 DIAGNOSIS — J449 Chronic obstructive pulmonary disease, unspecified: Secondary | ICD-10-CM | POA: Diagnosis present

## 2017-05-08 DIAGNOSIS — A419 Sepsis, unspecified organism: Secondary | ICD-10-CM | POA: Diagnosis present

## 2017-05-08 DIAGNOSIS — Y95 Nosocomial condition: Secondary | ICD-10-CM | POA: Diagnosis present

## 2017-05-08 DIAGNOSIS — Z515 Encounter for palliative care: Secondary | ICD-10-CM | POA: Diagnosis present

## 2017-05-08 DIAGNOSIS — Z880 Allergy status to penicillin: Secondary | ICD-10-CM

## 2017-05-08 LAB — CBC WITH DIFFERENTIAL/PLATELET
BASOS ABS: 0 10*3/uL (ref 0–0.1)
BASOS PCT: 1 %
EOS ABS: 0 10*3/uL (ref 0–0.7)
Eosinophils Relative: 0 %
HCT: 26.4 % — ABNORMAL LOW (ref 40.0–52.0)
HEMOGLOBIN: 8.3 g/dL — AB (ref 13.0–18.0)
Lymphocytes Relative: 8 %
Lymphs Abs: 0.4 10*3/uL — ABNORMAL LOW (ref 1.0–3.6)
MCH: 28.1 pg (ref 26.0–34.0)
MCHC: 31.4 g/dL — ABNORMAL LOW (ref 32.0–36.0)
MCV: 89.6 fL (ref 80.0–100.0)
Monocytes Absolute: 0.2 10*3/uL (ref 0.2–1.0)
Monocytes Relative: 5 %
NEUTROS PCT: 86 %
Neutro Abs: 3.7 10*3/uL (ref 1.4–6.5)
PLATELETS: 100 10*3/uL — AB (ref 150–440)
RBC: 2.94 MIL/uL — AB (ref 4.40–5.90)
RDW: 18.5 % — ABNORMAL HIGH (ref 11.5–14.5)
WBC: 4.3 10*3/uL (ref 3.8–10.6)

## 2017-05-08 LAB — COMPREHENSIVE METABOLIC PANEL
ALK PHOS: 159 U/L — AB (ref 38–126)
ALT: 84 U/L — AB (ref 17–63)
AST: 189 U/L — ABNORMAL HIGH (ref 15–41)
Albumin: 1.7 g/dL — ABNORMAL LOW (ref 3.5–5.0)
Anion gap: 7 (ref 5–15)
BUN: 33 mg/dL — ABNORMAL HIGH (ref 6–20)
CO2: 25 mmol/L (ref 22–32)
CREATININE: 1.85 mg/dL — AB (ref 0.61–1.24)
Calcium: 8.4 mg/dL — ABNORMAL LOW (ref 8.9–10.3)
Chloride: 109 mmol/L (ref 101–111)
GFR, EST AFRICAN AMERICAN: 37 mL/min — AB (ref >60)
GFR, EST NON AFRICAN AMERICAN: 32 mL/min — AB (ref >60)
Glucose, Bld: 72 mg/dL (ref 65–99)
Potassium: 3.9 mmol/L (ref 3.5–5.1)
Sodium: 141 mmol/L (ref 135–145)
Total Bilirubin: 2.9 mg/dL — ABNORMAL HIGH (ref 0.3–1.2)
Total Protein: 6 g/dL — ABNORMAL LOW (ref 6.5–8.1)

## 2017-05-08 LAB — URINALYSIS, ROUTINE W REFLEX MICROSCOPIC
Bilirubin Urine: NEGATIVE
Glucose, UA: NEGATIVE mg/dL
Hgb urine dipstick: NEGATIVE
Ketones, ur: NEGATIVE mg/dL
Leukocytes, UA: NEGATIVE
Nitrite: NEGATIVE
Protein, ur: NEGATIVE mg/dL
Specific Gravity, Urine: 1.01 (ref 1.005–1.030)
pH: 5 (ref 5.0–8.0)

## 2017-05-08 LAB — PROTIME-INR
INR: 2.16
PROTHROMBIN TIME: 24.4 s — AB (ref 11.4–15.2)

## 2017-05-08 LAB — LACTIC ACID, PLASMA
Lactic Acid, Venous: 2.2 mmol/L (ref 0.5–1.9)
Lactic Acid, Venous: 2.9 mmol/L (ref 0.5–1.9)

## 2017-05-08 LAB — TSH: TSH: 6.547 u[IU]/mL — ABNORMAL HIGH (ref 0.350–4.500)

## 2017-05-08 LAB — TROPONIN I: TROPONIN I: 0.08 ng/mL — AB (ref <0.03)

## 2017-05-08 LAB — GLUCOSE, CAPILLARY: Glucose-Capillary: 116 mg/dL — ABNORMAL HIGH (ref 65–99)

## 2017-05-08 MED ORDER — RISAQUAD PO CAPS: 1.0000 | ORAL_CAPSULE | Freq: Every day | ORAL | Status: DC

## 2017-05-08 MED ORDER — TAMSULOSIN HCL 0.4 MG PO CAPS: 0.4000 mg | ORAL_CAPSULE | Freq: Every day | ORAL | Status: DC

## 2017-05-08 MED ORDER — LEVOFLOXACIN IN D5W 750 MG/150ML IV SOLN
750.0000 mg | Freq: Once | INTRAVENOUS | Status: AC
  Administered 2017-05-08: 750 mg via INTRAVENOUS
  Filled 2017-05-08: qty 150

## 2017-05-08 MED ORDER — NOREPINEPHRINE BITARTRATE 1 MG/ML IV SOLN
0.0000 ug/min | Freq: Once | INTRAVENOUS | Status: AC
  Administered 2017-05-08: 2.667 ug/min via INTRAVENOUS
  Filled 2017-05-08: qty 4

## 2017-05-08 MED ORDER — SODIUM CHLORIDE 0.9 % IV BOLUS (SEPSIS)
1000.0000 mL | Freq: Once | INTRAVENOUS | Status: AC
  Administered 2017-05-08: 1000 mL via INTRAVENOUS

## 2017-05-08 MED ORDER — SODIUM CHLORIDE 0.9 % IV BOLUS (SEPSIS)
500.0000 mL | INTRAVENOUS | Status: AC
  Administered 2017-05-08: 500 mL via INTRAVENOUS

## 2017-05-08 MED ORDER — LORAZEPAM 2 MG/ML IJ SOLN: 1.0000 mg | INTRAMUSCULAR | Status: DC | PRN

## 2017-05-08 MED ORDER — FUROSEMIDE 40 MG PO TABS: 40.0000 mg | ORAL_TABLET | Freq: Every day | ORAL | Status: DC

## 2017-05-08 MED ORDER — MORPHINE SULFATE (PF) 2 MG/ML IV SOLN: 1.0000 mg | INTRAVENOUS | Status: DC | PRN

## 2017-05-08 MED ORDER — MIRTAZAPINE 15 MG PO TABS: 15.0000 mg | ORAL_TABLET | Freq: Every day | ORAL | Status: DC

## 2017-05-08 MED ORDER — SODIUM CHLORIDE 0.9 % IV SOLN
1.0000 g | Freq: Two times a day (BID) | INTRAVENOUS | Status: DC
  Filled 2017-05-08 (×2): qty 1

## 2017-05-08 MED ORDER — IPRATROPIUM-ALBUTEROL 0.5-2.5 (3) MG/3ML IN SOLN: 3.0000 mL | Freq: Four times a day (QID) | RESPIRATORY_TRACT | Status: DC

## 2017-05-08 MED ORDER — HEPARIN SODIUM (PORCINE) 5000 UNIT/ML IJ SOLN: 5000.0000 [IU] | Freq: Three times a day (TID) | INTRAMUSCULAR | Status: DC

## 2017-05-08 MED ORDER — ONDANSETRON HCL 4 MG/2ML IJ SOLN: 4.0000 mg | Freq: Four times a day (QID) | INTRAMUSCULAR | Status: DC | PRN

## 2017-05-08 MED ORDER — ONDANSETRON HCL 4 MG PO TABS: 4.0000 mg | ORAL_TABLET | Freq: Four times a day (QID) | ORAL | Status: DC | PRN

## 2017-05-08 MED ORDER — GLYCOPYRROLATE 0.2 MG/ML IJ SOLN: 0.2000 mg | INTRAMUSCULAR | Status: DC | PRN

## 2017-05-08 MED ORDER — IPRATROPIUM-ALBUTEROL 0.5-2.5 (3) MG/3ML IN SOLN: 3.0000 mL | Freq: Two times a day (BID) | RESPIRATORY_TRACT | Status: DC

## 2017-05-08 MED ORDER — LEVOFLOXACIN IN D5W 750 MG/150ML IV SOLN: 750.0000 mg | INTRAVENOUS | Status: DC

## 2017-05-08 MED ORDER — SODIUM CHLORIDE 0.9 % IV BOLUS (SEPSIS)
250.0000 mL | Freq: Once | INTRAVENOUS | Status: AC
  Administered 2017-05-08: 250 mL via INTRAVENOUS

## 2017-05-08 MED ORDER — MAGNESIUM OXIDE 400 (241.3 MG) MG PO TABS: 400.0000 mg | ORAL_TABLET | Freq: Every day | ORAL | Status: DC

## 2017-05-08 MED ORDER — POTASSIUM CHLORIDE CRYS ER 20 MEQ PO TBCR: 20.0000 meq | EXTENDED_RELEASE_TABLET | Freq: Every day | ORAL | Status: DC

## 2017-05-08 MED ORDER — DOCUSATE SODIUM 100 MG PO CAPS: 100.0000 mg | ORAL_CAPSULE | Freq: Two times a day (BID) | ORAL | Status: DC

## 2017-05-08 MED ORDER — MORPHINE SULFATE (PF) 2 MG/ML IV SOLN
INTRAVENOUS | Status: AC
  Filled 2017-05-08: qty 2

## 2017-05-08 MED ORDER — FERROUS SULFATE 324 (65 FE) MG PO TBEC: 1.0000 | DELAYED_RELEASE_TABLET | Freq: Every day | ORAL | Status: DC

## 2017-05-08 MED ORDER — ALLOPURINOL 100 MG PO TABS
100.0000 mg | ORAL_TABLET | Freq: Every day | ORAL | Status: DC
  Filled 2017-05-08: qty 1

## 2017-05-08 MED ORDER — VANCOMYCIN HCL IN DEXTROSE 1-5 GM/200ML-% IV SOLN
1000.0000 mg | Freq: Once | INTRAVENOUS | Status: AC
  Administered 2017-05-08: 1000 mg via INTRAVENOUS
  Filled 2017-05-08: qty 200

## 2017-05-08 MED ORDER — IOPAMIDOL (ISOVUE-300) INJECTION 61%
75.0000 mL | Freq: Once | INTRAVENOUS | Status: AC | PRN
  Administered 2017-05-08: 75 mL via INTRAVENOUS

## 2017-05-08 MED ORDER — DEXTROSE 5 % IV SOLN
2.0000 g | Freq: Once | INTRAVENOUS | Status: AC
  Administered 2017-05-08: 2 g via INTRAVENOUS
  Filled 2017-05-08: qty 2

## 2017-05-08 MED ORDER — ATORVASTATIN CALCIUM 20 MG PO TABS: 40.0000 mg | ORAL_TABLET | Freq: Every day | ORAL | Status: DC

## 2017-05-08 MED ORDER — METOPROLOL SUCCINATE 12.5 MG HALF TABLET
12.5000 mg | ORAL_TABLET | Freq: Two times a day (BID) | ORAL | Status: DC
Start: 2017-05-08 — End: 2017-05-08

## 2017-05-08 MED ORDER — MORPHINE SULFATE (PF) 4 MG/ML IV SOLN
4.0000 mg | INTRAVENOUS | Status: AC
  Administered 2017-05-08: 4 mg via INTRAVENOUS

## 2017-05-08 MED ORDER — DOPAMINE-DEXTROSE 3.2-5 MG/ML-% IV SOLN
0.0000 ug/kg/min | INTRAVENOUS | Status: DC
  Administered 2017-05-08: 4.357 ug/kg/min via INTRAVENOUS
  Filled 2017-05-08: qty 250

## 2017-05-08 MED ORDER — LORATADINE 10 MG PO TABS: 10.0000 mg | ORAL_TABLET | Freq: Every day | ORAL | Status: DC | PRN

## 2017-05-08 MED ORDER — COLCHICINE 0.6 MG PO TABS: 0.6000 mg | ORAL_TABLET | Freq: Every day | ORAL | Status: DC | PRN

## 2017-05-08 MED ORDER — PANTOPRAZOLE SODIUM 40 MG PO TBEC: 40.0000 mg | DELAYED_RELEASE_TABLET | Freq: Every day | ORAL | Status: DC

## 2017-05-08 MED ORDER — VANCOMYCIN HCL IN DEXTROSE 750-5 MG/150ML-% IV SOLN
750.0000 mg | INTRAVENOUS | Status: DC
  Filled 2017-05-08: qty 150

## 2017-05-08 MED ORDER — ACETAMINOPHEN 325 MG PO TABS: 650.0000 mg | ORAL_TABLET | Freq: Four times a day (QID) | ORAL | Status: DC | PRN

## 2017-05-08 MED ORDER — ACETAMINOPHEN 650 MG RE SUPP: 650.0000 mg | Freq: Four times a day (QID) | RECTAL | Status: DC | PRN

## 2017-05-08 MED ORDER — SODIUM CHLORIDE 0.9 % IV SOLN: INTRAVENOUS | Status: DC

## 2017-05-09 LAB — HEMOGLOBIN A1C
Hgb A1c MFr Bld: 4.3 % — ABNORMAL LOW (ref 4.8–5.6)
Mean Plasma Glucose: 77 mg/dL

## 2017-05-10 ENCOUNTER — Telehealth: Payer: Self-pay

## 2017-05-10 DIAGNOSIS — R6521 Severe sepsis with septic shock: Secondary | ICD-10-CM

## 2017-05-10 DIAGNOSIS — A419 Sepsis, unspecified organism: Secondary | ICD-10-CM

## 2017-05-10 NOTE — Telephone Encounter (Signed)
Informed funeral home that death cert is ready for pickup. Placed up front.

## 2017-05-10 NOTE — Telephone Encounter (Signed)
Recieved Death Certificate from [facility]_[employer]_________ Delivered/Placed _Nurses desk ___________

## 2017-05-10 NOTE — Telephone Encounter (Signed)
Death certificate placed in DS folder. 

## 2017-05-13 LAB — CULTURE, BLOOD (ROUTINE X 2)
Culture: NO GROWTH
Culture: NO GROWTH
SPECIAL REQUESTS: ADEQUATE

## 2017-05-20 NOTE — ED Provider Notes (Signed)
I assumed care of the patient from [place]. History of physical exam laboratory data consistent with sepsis. Patient received appropriate IV antibiotics and IV hydration.  CRITICAL CARE Performed by: Darci CurrentANDOLPH N Ociel Retherford   Total critical care time: 60 minutes  Critical care time was exclusive of separately billable procedures and treating other patients.  Critical care was necessary to treat or prevent imminent or life-threatening deterioration.  Critical care was time spent personally by me on the following activities: development of treatment plan with patient and/or surrogate as well as nursing, discussions with consultants, evaluation of patient's response to treatment, examination of patient, obtaining history from patient or surrogate, ordering and performing treatments and interventions, ordering and review of laboratory studies, ordering and review of radiographic studies, pulse oximetry and re-evaluation of patient's condition.   Patient markedly hypotensive and as such central line attempted in the femoral region however despite being able to cannulate the vessel without difficulty unable to introduce the wire and felt as though it was been instructed by something intravascularly. Given concern for possible thrombus procedure was discontinued. Dr. Earlene Plateravis general surgeon placed a IJ central line.   Darci CurrentBrown, Howard N, MD 07-20-17 [id number]

## 2017-05-20 NOTE — Progress Notes (Signed)
Pharmacy Antibiotic Note  Gregory [name] is a 83 y.o. male admitted on 06-17-2017 with sepsis.  Pharmacy has been consulted for vanc/meropenem/levaquin dosing.  Plan: Patient received vanc 1g IV x 1 in ED  Will follow up w/ vanc 750 mg IV daily w/ 8 hour stack dose. Will check a VT 1000 @ 6/22 prior to 4th dose. Ke 0.026 T1/2 24 hours   Will initiate meropenem 1g IV q12h per CrCl 20 - 50 ml/min Will initiate Levaquin 750 mg q48h per CrCl < 30 ml/min Last QTc on ECG on 12/04/2016: 637 msec Waiting on follow-up ECG  Height: 5\' 8"  (172.7 cm) Weight: 135 lb (61.2 kg) IBW/kg (Calculated) : 68.4  Temp (24hrs), Avg:91.2 F (32.9 C), Min:90.1 F (32.3 C), Max:91.6 F (33.1 C)   Recent Labs Lab 01/22/17 0051 01/22/17 0052 01/22/17 0453  WBC 4.3  --   --   CREATININE 1.85*  --   --   LATICACIDVEN  --  2.2* 2.9*    Estimated Creatinine Clearance: 26.2 mL/min (A) (by C-G formula based on SCr of 1.85 mg/dL (H)).    Allergies  Allergen Reactions  . Cortisone Hives  . Penicillins Hives and Other (See Comments)    Unable to obtain enough information to answer additional questions about this medication.      Thank you for allowing pharmacy to be a part of this patient's care.  Thomasene Rippleavid Angelina Neece, PharmD, BCPS Clinical Pharmacist 06-17-2017

## 2017-05-20 NOTE — Care Management (Signed)
RNCM contacted patient's son. He states [facility] and "has only been given a few hours to live". I offered my condolence and prayers. CSW updated.

## 2017-05-20 NOTE — H&P (Signed)
Gregory [name] is an 83 y.o. male.   Chief Complaint: Hypothermia HPI: The patient with past medical history of CHF, hypertension and dementia presents to the emergency department with hypothermia. The patient was sent from his nursing home due to hypothymia and hypotension.  The patient had been started on doxycycline this week but had not received any in the last few days due to a faint rash that was presumed to be a drug reaction. However, the patient's sons state that he intermittently has this rash independent of antibiotic administration. In the emergency department he was found to have a core temperature of approximately 57F. The patient was also markedly hypotensive. Blood cultures were immediately obtained and the patient started on broad-spectrum antibiotics. Source of infection was not immediately clear. Following multiple saline boluses the patient's blood pressure remained low Central venous catheter placement was obtained and the right IJ. He was started on dopamine and levofloxacin prior to the hospitalist service being called for admission.  Past Medical History:  Diagnosis Date  . BPH (benign prostatic hyperplasia)   . Chronic systolic CHF (congestive heart failure) (HCC)    EF of 56% with diastolic dysfunction, grade I  . Coronary artery disease    s/p CABG in 2008 and a recent catheterization which showed no new occlusion   . Dementia   . History of anemia    secondary to multiple AVMs  . Hyperlipidemia   . Hypertension   . Obstructive sleep apnea     Past Surgical History:  Procedure Laterality Date  . CORONARY ARTERY BYPASS GRAFT  2008   x2, negative cath about 8 months ago  . HEMORRHOID SURGERY    . INSERT / REPLACE / REMOVE PACEMAKER    . REFRACTIVE SURGERY      Family History  Problem Relation Age of Onset  . Cancer Mother        gastric  . Coronary artery disease Sister   . Lung cancer Brother    Social History:  reports that he quit smoking about 34  years ago. His smoking use included Cigarettes. He has never used smokeless tobacco. He reports that he does not drink alcohol or use drugs.  Allergies:  Allergies  Allergen Reactions  . Cortisone Hives  . Penicillins Hives and Other (See Comments)    Unable to obtain enough information to answer additional questions about this medication.      Medications Prior to Admission  Medication Sig Dispense Refill  . acidophilus (RISAQUAD) CAPS capsule Take 1 capsule by mouth daily.    Marland Kitchen allopurinol (ZYLOPRIM) 100 MG tablet Take 100 mg by mouth daily.    Marland Kitchen atorvastatin (LIPITOR) 40 MG tablet Take 40 mg by mouth daily.    . colchicine 0.6 MG tablet Take 0.6 mg by mouth daily as needed (gout).    Marland Kitchen doxycycline (VIBRA-TABS) 100 MG tablet Take 100 mg by mouth 2 (two) times daily.    . ferrous sulfate 324 (65 Fe) MG TBEC Take 1 tablet by mouth daily.    . furosemide (LASIX) 20 MG tablet Take 40 mg by mouth daily.     Marland Kitchen ipratropium-albuterol (DUONEB) 0.5-2.5 (3) MG/3ML SOLN Take 3 mLs by nebulization 2 (two) times daily.    Marland Kitchen loratadine (CLARITIN) 10 MG tablet Take 10 mg by mouth daily as needed for allergies.    . magnesium oxide (MAG-OX) 400 MG tablet Take 400 mg by mouth daily.    . metoprolol succinate (TOPROL-XL) 25 MG 24 hr  tablet Take 12.5 mg by mouth 2 (two) times daily.    . mirtazapine (REMERON) 15 MG tablet Take 15 mg by mouth at bedtime.    Marland Kitchen omeprazole (PRILOSEC) 20 MG capsule Take 20 mg by mouth daily.    . potassium chloride SA (K-DUR,KLOR-CON) 20 MEQ tablet Take 20 mEq by mouth daily.    Marland Kitchen senna-docusate (SENOKOT-S) 8.6-50 MG tablet Take 1 tablet by mouth daily as needed for mild constipation.    . Tamsulosin HCl (FLOMAX) 0.4 MG CAPS Take 0.4 mg by mouth at bedtime.     Marland Kitchen aspirin EC 81 MG tablet Take 1 tablet (81 mg total) by mouth daily. (Patient not taking: Reported on 17-May-2017) 30 tablet 0  . phenol-menthol (CEPASTAT) 14.5 MG lozenge Place 1 lozenge inside cheek as needed for sore  throat. (Patient not taking: Reported on 05/17/2017) 100 tablet 0  . predniSONE (STERAPRED UNI-PAK 21 TAB) 10 MG (21) TBPK tablet Take 1 tablet (10 mg total) by mouth daily. start 60 mg by mouth daily, taper 10 mg daily until finished (Patient not taking: Reported on May 17, 2017) 21 tablet 0    Results for orders placed or performed during the hospital encounter of 2017/05/17 (from the past 48 hour(s))  Comprehensive metabolic panel     Status: Abnormal   Collection Time: 2017-05-17 12:51 AM  Result Value Ref Range   Sodium 141 135 - 145 mmol/L   Potassium 3.9 3.5 - 5.1 mmol/L    Comment: HEMOLYSIS AT THIS LEVEL MAY AFFECT RESULT   Chloride 109 101 - 111 mmol/L   CO2 25 22 - 32 mmol/L   Glucose, Bld 72 65 - 99 mg/dL   BUN 33 (H) 6 - 20 mg/dL   Creatinine, Ser 1.85 (H) 0.61 - 1.24 mg/dL   Calcium 8.4 (L) 8.9 - 10.3 mg/dL   Total Protein 6.0 (L) 6.5 - 8.1 g/dL   Albumin 1.7 (L) 3.5 - 5.0 g/dL   AST 189 (H) 15 - 41 U/L   ALT 84 (H) 17 - 63 U/L   Alkaline Phosphatase 159 (H) 38 - 126 U/L   Total Bilirubin 2.9 (H) 0.3 - 1.2 mg/dL   GFR calc non Af Amer 32 (L) >60 mL/min   GFR calc Af Amer 37 (L) >60 mL/min    Comment: (NOTE) The eGFR has been calculated using the CKD EPI equation. This calculation has not been validated in all clinical situations. eGFR's persistently <60 mL/min signify possible Chronic Kidney Disease.    Anion gap 7 5 - 15  CBC WITH DIFFERENTIAL     Status: Abnormal   Collection Time: May 17, 2017 12:51 AM  Result Value Ref Range   WBC 4.3 3.8 - 10.6 K/uL   RBC 2.94 (L) 4.40 - 5.90 MIL/uL   Hemoglobin 8.3 (L) 13.0 - 18.0 g/dL   HCT 26.4 (L) 40.0 - 52.0 %   MCV 89.6 80.0 - 100.0 fL   MCH 28.1 26.0 - 34.0 pg   MCHC 31.4 (L) 32.0 - 36.0 g/dL   RDW 18.5 (H) 11.5 - 14.5 %   Platelets 100 (L) 150 - 440 K/uL   Neutrophils Relative % 86 %   Neutro Abs 3.7 1.4 - 6.5 K/uL   Lymphocytes Relative 8 %   Lymphs Abs 0.4 (L) 1.0 - 3.6 K/uL   Monocytes Relative 5 %   Monocytes  Absolute 0.2 0.2 - 1.0 K/uL   Eosinophils Relative 0 %   Eosinophils Absolute 0.0 0 - 0.7 K/uL  Basophils Relative 1 %   Basophils Absolute 0.0 0 - 0.1 K/uL  Blood Culture (routine x 2)     Status: None (Preliminary result)   Collection Time: 05-30-17 12:51 AM  Result Value Ref Range   Specimen Description BLOOD R FOREARM    Special Requests      BOTTLES DRAWN AEROBIC AND ANAEROBIC Blood Culture adequate volume   Culture NO GROWTH < 12 HOURS    Report Status PENDING   Troponin I     Status: Abnormal   Collection Time: May 30, 2017 12:51 AM  Result Value Ref Range   Troponin I 0.08 (HH) <0.03 ng/mL    Comment: CRITICAL RESULT CALLED TO, READ BACK BY AND VERIFIED WITH RACHEL HAYDEN RN AT 0135 05-30-17 [facility].   Protime-INR     Status: Abnormal   Collection Time: 05-30-17 12:51 AM  Result Value Ref Range   Prothrombin Time 24.4 (H) 11.4 - 15.2 seconds   INR 2.16   TSH     Status: Abnormal   Collection Time: 2017-05-30 12:51 AM  Result Value Ref Range   TSH 6.547 (H) 0.350 - 4.500 uIU/mL    Comment: Performed by a 3rd Generation assay with a functional sensitivity of <=0.01 uIU/mL.  Lactic acid, plasma     Status: Abnormal   Collection Time: 05-30-17 12:52 AM  Result Value Ref Range   Lactic Acid, Venous 2.2 (HH) 0.5 - 1.9 mmol/L    Comment: CRITICAL RESULT CALLED TO, READ BACK BY AND VERIFIED WITH RACHEL HAYDEN RN AT [phone] May 30, 2017 [facility]   Blood Culture (routine x 2)     Status: None (Preliminary result)   Collection Time: 2017/05/30  1:42 AM  Result Value Ref Range   Specimen Description BLOOD LT FOREARM    Special Requests      BOTTLES DRAWN AEROBIC AND ANAEROBIC Blood Culture results may not be optimal due to an inadequate volume of blood received in culture bottles   Culture NO GROWTH < 12 HOURS    Report Status PENDING   Lactic acid, plasma     Status: Abnormal   Collection Time: 05-30-2017  4:53 AM  Result Value Ref Range   Lactic Acid, Venous 2.9 (HH) 0.5 - 1.9 mmol/L    Comment:  CRITICAL RESULT CALLED TO, READ BACK BY AND VERIFIED WITH LISA THOMPSON AT 0535 ON 05/30/2017 [facility].   Urinalysis, Routine w reflex microscopic     Status: Abnormal   Collection Time: 05-30-17  6:34 AM  Result Value Ref Range   Color, Urine YELLOW (A) YELLOW   APPearance HAZY (A) CLEAR   Specific Gravity, Urine 1.010 1.005 - 1.030   pH 5.0 5.0 - 8.0   Glucose, UA NEGATIVE NEGATIVE mg/dL   Hgb urine dipstick NEGATIVE NEGATIVE   Bilirubin Urine NEGATIVE NEGATIVE   Ketones, ur NEGATIVE NEGATIVE mg/dL   Protein, ur NEGATIVE NEGATIVE mg/dL   Nitrite NEGATIVE NEGATIVE   Leukocytes, UA NEGATIVE NEGATIVE   Ct Abdomen Pelvis W Contrast  Result Date: 05/30/17 CLINICAL DATA:  Dyspnea. Generalized abdominal pain. Hypothermia, hypotension, and hypoglycemia. EXAM: CT ABDOMEN AND PELVIS WITH CONTRAST TECHNIQUE: Multidetector CT imaging of the abdomen and pelvis was performed using the standard protocol following bolus administration of intravenous contrast. CONTRAST:  11mL ISOVUE-300 IOPAMIDOL (ISOVUE-300) INJECTION 61% COMPARISON:  CTA 01/10/2014 FINDINGS: Lower chest: Small to moderate bilateral pleural effusions, right greater than left. Adjacent compressive atelectasis in the lower lobes. Ground-glass opacities in the anterior right middle lobe, partially included. Probable chronic interstitial  lung disease in the lower lobes. Right heart dilatation, pacemaker partially included. Hepatobiliary: Nodular hepatic contours suggesting cirrhosis. No evidence of focal lesion allowing for streak artifact from arms down positioning. Unchanged pneumobilia on the left lobe postcholecystectomy without biliary dilatation. Pancreas: Parenchymal atrophy. No ductal dilatation or inflammation. Spleen: Borderline splenomegaly measuring 12.9 cm craniocaudal. No focal splenic lesion. There left upper quadrant varices. Adrenals/Urinary Tract: No adrenal nodule. No hydronephrosis or perinephric edema. Parenchymal volume loss in the  anterior lower left kidney with wedge-shaped decreased density consistent with lower pole renal infarct, this is unchanged from prior exam. Multiple bilateral renal cysts, and suboptimally assessed due to streak artifact from arms down positioning. Renovascular calcifications at both hila, no definite urolithiasis. Absent excretion on delayed phase imaging suggesting renal dysfunction. Urinary bladder is decompressed by Foley catheter. Stomach/Bowel: Suggest no mild diffuse gastric wall thickening. Mildly Tinel wall thickening. No dilated small bowel to suggest obstruction. Bowel evaluation is suboptimal given lack of enteric contrast and intra-abdominal ascites. Sigmoid colon is decompressed, difficult to exclude wall thickening. Questionable wall thickening about the splenic flexure of the colon. Appendix is not identified. Vascular/Lymphatic: Advanced calcified and noncalcified atheromatous plaque throughout the abdominal aorta. Noncalcified plaque/mural thrombus in the infrarenal aorta causes luminal narrowing. There is infrarenal a ectasia up to 2.7 cm. Atherosclerotic calcification at the aortic branch vessels causing varying degrees of luminal narrowing, not well characterized on non CTA exam. No bulky adenopathy, limited assessment given intra-abdominal ascites. Reproductive: Prostate is unremarkable. Other: Moderate volume of intra-abdominopelvic ascites. No free air. There is body wall edema, confluent in the flanks. Musculoskeletal: There are no acute or suspicious osseous abnormalities. General change in the spine. IMPRESSION: 1. Nodular hepatic contours consistent with cirrhosis. Moderate volume of intra-abdominal and pelvic ascites. Borderline splenomegaly with varices series in the left upper quadrant. 2. Small to moderate bilateral pleural effusions. Whole body wall edema. Findings consistent with third-spacing. 3. Advanced aortic atherosclerosis with noncalcified plaque/mural thrombus causing  luminal narrowing of the infrarenal aorta. Infrarenal ectasia a 2.7 cm at risk for aneurysm development. Recommend followup by ultrasound in 5 years. This recommendation follows ACR consensus guidelines: White Paper of the ACR Incidental Findings Committee II on Vascular Findings. J Am Coll Radiol 2013; 10:789-794. 4. Wall thickening of the stomach, duodenum, and likely portions of the colon can be seen in the setting of portal enteropathy or bowel inflammation. Portal enteropathy is favored. 5. Bilateral renal cysts, grossly stable from prior. Scarring in the lower left kidney is unchanged. Electronically Signed   By: Jeb Levering M.D.   On: 05/20/17 03:48   Dg Chest Port 1 View  Result Date: 05-20-17 CLINICAL DATA:  Initial evaluation for right IJ central line placement. EXAM: PORTABLE CHEST 1 VIEW COMPARISON:  Prior radiograph from earlier the same day. FINDINGS: There has been interval placement of a right IJ approach central venous catheter with tip overlying the left-sided pacemaker/AICD noted. Median sternotomy wires underlying CABG markers and surgical clips. Stable cardiomegaly. Mediastinal silhouette normal. Diffuse pulmonary vascular congestion with interstitial prominence as is slightly increased as compared to previous exam, suggesting slightly worsened pulmonary interstitial edema. Superimposed coarse parenchymal opacity at the left lung base relatively stable. No other new focal airspace disease. No pneumothorax status post central line placement. Osseous structures unchanged. IMPRESSION: 1. Interval placement of right IJ approach central venous catheter with tip overlying the proximal-mid right atrium. 2. Slightly worsened pulmonary vascular congestion and interstitial prominence as compared to prior radiograph from earlier the same  day, suggesting worsened pulmonary interstitial edema. 3. Stable parenchymal left basilar opacity. Electronically Signed   By: Jeannine Boga M.D.   On:  2017/06/03 06:37   Dg Chest Port 1 View  Result Date: 06-03-17 CLINICAL DATA:  83 year old male with hypothermia and hypoglycemia and hypotension. EXAM: PORTABLE CHEST 1 VIEW COMPARISON:  Chest radiograph dated 03/02/2017 FINDINGS: There is diffuse interstitial coarsening with patchy areas of airspace density at the lung bases and predominantly involving the left lung base, likely chronic changes and scarring. No new focal consolidation. No pleural effusion or pneumothorax. Stable cardiac silhouette. Median sternotomy wires and CABG vascular clips noted. Left pectoral dual lead pacemaker device. No acute osseous pathology. IMPRESSION: Diffuse interstitial coarsening and patchy areas of interstitial and airspace density at the left lung base similar to prior radiograph and likely chronic. No new consolidative changes. Electronically Signed   By: Anner Crete M.D.   On: Jun 03, 2017 01:03    Review of Systems  Unable to perform ROS: Medical condition    Blood pressure (!) 72/47, pulse 69, temperature (!) 90.5 F (32.5 C), resp. rate (!) 23, height _0  (1.727 m), weight 61.2 kg (135 lb), SpO2 (!) 83 %. Physical Exam  Nursing note and vitals reviewed. Constitutional: He appears well-developed and well-nourished. He appears listless. He appears distressed.  HENT:  Head: Normocephalic and atraumatic.  Mouth/Throat: Oropharynx is clear and moist.  Eyes: Conjunctivae and EOM are normal. Pupils are equal, round, and reactive to light. No scleral icterus.  Neck: Normal range of motion. Neck supple. No JVD present. No tracheal deviation present. No thyromegaly present.  Cardiovascular: Normal rate, regular rhythm and normal heart sounds.  Exam reveals no gallop and no friction rub.   No murmur heard. Respiratory: Tachypnea noted. He is in respiratory distress.  GI: Soft. Bowel sounds are normal. He exhibits no distension. There is no tenderness.  Genitourinary:  Genitourinary Comments: Deferred   Musculoskeletal: Normal range of motion. He exhibits no edema.  Lymphadenopathy:    He has no cervical adenopathy.  Neurological: He appears listless.  Skin: Skin is warm and dry. Ecchymosis noted. No rash noted. No erythema.  Mottling of skin  Psychiatric:  Difficult to assess mental status as the patient is listless     Assessment/Plan This is an 83 year old male admitted for sepsis. 1. Sepsis: With shock; source undetermined. The patient is on pressor. support. Continue aztreonam, levofloxacin and vancomycin. 2. CHF: Chronic; diastolic and systolic. Furosemide as needed. 3. Acute kidney injury: Prerenal; aggressive IV hydration normal saline. Avoid nephrotoxic agents 4. Transaminitis: may be related to decreased liver perfusion however ratio of AST to ALT is concerning for cholangitis. Notably the patient has had cholecystectomy approximately 7 years ago. 5. Hypertension: Hold antihypertensive medication as the patient is in shock 6. BPH: Continue tamsulosin 7. DVT prophylaxis: Heparin 8. GI prophylaxis: None The patient is a DO NOT RESUSCITATE. Time spent on admission orders and critical care approximately 45 minutes  Harrie Foreman, MD 2017-06-03, 8:09 AM

## 2017-05-20 NOTE — Progress Notes (Signed)
Chart reviewed and case discussed with nursing. Patient is comfort care

## 2017-05-20 NOTE — ED Notes (Signed)
Dr Sheryle Hailiamond notified lactic acid 2.9 per lab

## 2017-05-20 NOTE — ED Provider Notes (Signed)
[facility] Emergency Department Provider Note  ____________________________________________   First MD Initiated Contact with Patient 01/25/17 0032     (approximate)  I have reviewed the triage vital signs and the nursing notes.   HISTORY  Chief Complaint Hypotension; Cold Exposure; and Respiratory Distress   HPI Gregory [name] is a 83 y.o. male . Presents to the emergency department from his nursing home for evaluation after they were unable to get blood pressure reading and his axillary temperature was 92. He is currently being treated with outpatient antibiotics for left lower lobe pneumonia. Sons also state that he had some blood work that indicated an anemia and they had intended to get a CT of his abdomen tomorrow to rule out any "bleeding." Sons are at bedside and indicated if he worsens that the DO NOT RESUSCITATE certificate is to be honored.   Past Medical History:  Diagnosis Date  . BPH (benign prostatic hyperplasia)   . Chronic systolic CHF (congestive heart failure) (HCC)    EF of 45% with diastolic dysfunction, grade I  . Coronary artery disease    s/p CABG in 2008 and a recent catheterization which showed no new occlusion   . Dementia   . History of anemia    secondary to multiple AVMs  . Hyperlipidemia   . Hypertension   . Obstructive sleep apnea     Patient Active Problem List   Diagnosis Date Noted  . Septic shock (HCC)   . Sepsis (HCC) 2017/01/07  . Chest pain 12/02/2016  . Goals of care, counseling/discussion   . Palliative care encounter   . COPD exacerbation (HCC) 11/15/2016  . Sick sinus syndrome (HCC) 10/14/2015  . History of permanent cardiac pacemaker placement 10/14/2015  . Thrombocytopenia (HCC) 10/14/2015  . Syncope 10/12/2015  . Chronic combined systolic and diastolic CHF (congestive heart failure) (HCC) 10/08/2015  . Tachycardia 10/08/2015  . Atrial fibrillation with RVR (HCC) 10/08/2015  . Hypotension  10/08/2015  . Leukopenia 10/08/2015  . Near syncope 10/05/2015  . Arrhythmia 10/05/2015  . CAD (coronary artery disease) 10/05/2015  . HTN (hypertension) 10/05/2015  . HLD (hyperlipidemia) 10/05/2015  . BPH (benign prostatic hyperplasia) 10/05/2015    Past Surgical History:  Procedure Laterality Date  . CORONARY ARTERY BYPASS GRAFT  2008   x2, negative cath about 8 months ago  . HEMORRHOID SURGERY    . INSERT / REPLACE / REMOVE PACEMAKER    . REFRACTIVE SURGERY      Prior to Admission medications   Medication Sig Start Date End Date Taking? Authorizing Provider  acidophilus (RISAQUAD) CAPS capsule Take 1 capsule by mouth daily. 05/04/17 05/14/17 Yes [provider]  allopurinol (ZYLOPRIM) 100 MG tablet Take 100 mg by mouth daily.   Yes [provider]  atorvastatin (LIPITOR) 40 MG tablet Take 40 mg by mouth daily.   Yes [provider]  colchicine 0.6 MG tablet Take 0.6 mg by mouth daily as needed (gout).   Yes [provider]  doxycycline (VIBRA-TABS) 100 MG tablet Take 100 mg by mouth 2 (two) times daily.   Yes [provider]  ferrous sulfate 324 (65 Fe) MG TBEC Take 1 tablet by mouth daily.   Yes [provider]  furosemide (LASIX) 20 MG tablet Take 40 mg by mouth daily.    Yes [provider]  ipratropium-albuterol (DUONEB) 0.5-2.5 (3) MG/3ML SOLN Take 3 mLs by nebulization 2 (two) times daily. 05/04/17 05/11/17 Yes [provider]  loratadine (  CLARITIN) 10 MG tablet Take 10 mg by mouth daily as needed for allergies.   Yes [provider]  magnesium oxide (MAG-OX) 400 MG tablet Take 400 mg by mouth daily.   Yes [provider]  metoprolol succinate (TOPROL-XL) 25 MG 24 hr tablet Take 12.5 mg by mouth 2 (two) times daily.   Yes [provider]  mirtazapine (REMERON) 15 MG tablet Take 15 mg by mouth at bedtime.   Yes [provider]  omeprazole (PRILOSEC) 20 MG capsule Take 20  mg by mouth daily.   Yes [provider]  potassium chloride SA (K-DUR,KLOR-CON) 20 MEQ tablet Take 20 mEq by mouth daily.   Yes [provider]  senna-docusate (SENOKOT-S) 8.6-50 MG tablet Take 1 tablet by mouth daily as needed for mild constipation.   Yes [provider]  Tamsulosin HCl (FLOMAX) 0.4 MG CAPS Take 0.4 mg by mouth at bedtime.    Yes [provider]  aspirin EC 81 MG tablet Take 1 tablet (81 mg total) by mouth daily. Patient not taking: Reported on 05-24-17 12/04/16   Altamese Dilling, MD  phenol-menthol (CEPASTAT) 14.5 MG lozenge Place 1 lozenge inside cheek as needed for sore throat. Patient not taking: Reported on 05-24-17 12/03/16   Altamese Dilling, MD  predniSONE (STERAPRED UNI-PAK 21 TAB) 10 MG (21) TBPK tablet Take 1 tablet (10 mg total) by mouth daily. start 60 mg by mouth daily, taper 10 mg daily until finished Patient not taking: Reported on 24-May-2017 11/17/16   Delfino Lovett, MD    Allergies Cortisone and Penicillins  Family History  Problem Relation Age of Onset  . Cancer Mother        gastric  . Coronary artery disease Sister   . Lung cancer Brother     Social History Social History  Substance Use Topics  . Smoking status: Former Smoker    Types: Cigarettes    Quit date: 11/20/1982  . Smokeless tobacco: Never Used  . Alcohol use No    Review of SystemsIs impossible due to end-stage dementia, and current Critical illness.  Constitutional:  Eyes:  ENT:  Cardiovascular: Respiratory:  Gastrointestinal: Genitourinary:  Musculoskeletal:  Skin: Neurological:   ___________________________________________   PHYSICAL EXAM:  VITAL SIGNS: ED Triage Vitals  Enc Vitals Group     BP 05/24/17 0044 (!) 76/52     Pulse Rate May 24, 2017 0044 64     Resp 2017-05-24 0044 20     Temp 05-24-17 0044 (!) 90.5 F (32.5 C)     Temp Source 2017/05/24 0044 Rectal     SpO2 2017/05/24 0036 (!) 84 %     Weight 05-24-2017 0045  135 lb (61.2 kg)     Height May 24, 2017 0045 5\' 8"  (1.727 m)     Head Circumference --      Peak Flow --      Pain Score --      Pain Loc --      Pain Edu? --      Excl. in GC? --     Constitutional: Appears in mild distress Eyes: Conjunctivae are normal. Head: Atraumatic. Nose: No epistaxis Mouth/Throat: Mucous membranes are dry.  Airway is patent Neck: No stridor.   Cardiovascular: Normal rate, regular rhythm. Muffled heart sounds.  Good peripheral circulation. Respiratory: Mild respiratory distress. Respirations are even with audible rhonchi. Gastrointestinal: Slightly firm with taught skin. Bowel sounds hypoactive in all quadrants. Musculoskeletal: Bilateral edema of the feet and ankles. Neurologic: Nonverbal at baseline. Does  not follow commands. Responds to painful stimuli. Skin:  Skin is cool. Multiple skin tears on extremities. Ecchymosis noted over the left upper chest wall. ____________________________________________   LABS (all labs ordered are listed, but only abnormal results are displayed)  Labs Reviewed  COMPREHENSIVE METABOLIC PANEL - Abnormal; Notable for the following:       Result Value   BUN 33 (*)    Creatinine, Ser 1.85 (*)    Calcium 8.4 (*)    Total Protein 6.0 (*)    Albumin 1.7 (*)    AST 189 (*)    ALT 84 (*)    Alkaline Phosphatase 159 (*)    Total Bilirubin 2.9 (*)    GFR calc non Af Amer 32 (*)    GFR calc Af Amer 37 (*)    All other components within normal limits  CBC WITH DIFFERENTIAL/PLATELET - Abnormal; Notable for the following:    RBC 2.94 (*)    Hemoglobin 8.3 (*)    HCT 26.4 (*)    MCHC 31.4 (*)    RDW 18.5 (*)    Platelets 100 (*)    Lymphs Abs 0.4 (*)    All other components within normal limits  URINALYSIS, ROUTINE W REFLEX MICROSCOPIC - Abnormal; Notable for the following:    Color, Urine YELLOW (*)    APPearance HAZY (*)    All other components within normal limits  LACTIC ACID, PLASMA - Abnormal; Notable for the  following:    Lactic Acid, Venous 2.2 (*)    All other components within normal limits  LACTIC ACID, PLASMA - Abnormal; Notable for the following:    Lactic Acid, Venous 2.9 (*)    All other components within normal limits  TROPONIN I - Abnormal; Notable for the following:    Troponin I 0.08 (*)    All other components within normal limits  PROTIME-INR - Abnormal; Notable for the following:    Prothrombin Time 24.4 (*)    All other components within normal limits  TSH - Abnormal; Notable for the following:    TSH 6.547 (*)    All other components within normal limits  HEMOGLOBIN A1C - Abnormal; Notable for the following:    Hgb A1c MFr Bld 4.3 (*)    All other components within normal limits  GLUCOSE, CAPILLARY - Abnormal; Notable for the following:    Glucose-Capillary 116 (*)    All other components within normal limits  CULTURE, BLOOD (ROUTINE X 2)  CULTURE, BLOOD (ROUTINE X 2)   ____________________________________________  EKG   ____________________________________________  RADIOLOGY  No results found.   CT Abdomen and Pelvis IMPRESSION:  1. Nodular hepatic contours consistent with cirrhosis. Moderate  volume of intra-abdominal and pelvic ascites. Borderline  splenomegaly with varices series in the left upper quadrant.  2. Small to moderate bilateral pleural effusions. Whole body wall  edema. Findings consistent with third-spacing.  3. Advanced aortic atherosclerosis with noncalcified plaque/mural  thrombus causing luminal narrowing of the infrarenal aorta.  Infrarenal ectasia a 2.7 cm at risk for aneurysm development.  Recommend followup by ultrasound in 5 years. This recommendation  follows ACR consensus guidelines: White Paper of the ACR Incidental  Findings Committee II on Vascular Findings. J Am Coll Radiol 2013;  10:789-794.  4. Wall thickening of the stomach, duodenum, and likely portions of  the colon can be seen in the setting of portal enteropathy or  bowel  inflammation. Portal enteropathy is favored.  5. Bilateral renal cysts, grossly stable  from prior. Scarring in the  lower left kidney is unchanged.   Chest X-ray: IMPRESSION:  1. Interval placement of right IJ approach central venous catheter  with tip overlying the proximal-mid right atrium.  2. Slightly worsened pulmonary vascular congestion and interstitial  prominence as compared to prior radiograph from earlier the same  day, suggesting worsened pulmonary interstitial edema.  3. Stable parenchymal left basilar opacity.    ____________________________________________   PROCEDURES  Procedure(s) performed: None  Procedures  Critical Care performed: Yes, see critical care note(s)  ____________________________________________   INITIAL IMPRESSION / ASSESSMENT AND PLAN / ED COURSE  Pertinent labs & imaging results that were available during my care of the patient were reviewed by me and considered in my medical decision making (see chart for details).  83 year old male presenting to the emergency department from [facility] with symptoms consistent with sepsis. While in the emergency department, he was treated per protocol. Fluids and antibiotics initiated once IV access was obtained. He was an extremely difficult stick. EJ was unsuccessful by myself and Dr. Manson Passey. Family was kept well informed of patient status and necessity of procedures.   Care relinquished to Dr. Manson Passey who will arrange admission after CT of the abdomen is complete.        ____________________________________________   FINAL CLINICAL IMPRESSION(S) / ED DIAGNOSES  Final diagnoses:  Sepsis, due to unspecified organism [facility])      NEW MEDICATIONS STARTED DURING THIS VISIT:  Discharge Medication List as of May 14, 2017  3:45 PM       Note:  This document was prepared using Dragon voice recognition software and may include unintentional dictation errors.    Chinita Pester, FNP 05/10/17 1301    Darci Current, MD 05/11/17 [phone]

## 2017-05-20 NOTE — ED Triage Notes (Signed)
Pt presents from care facility w/ c/o hypothermia. Pt also has hypotension and hypoglycemia w/ AMS. Pt unable to participate in care reporting. Pt has skin tear to L upper arm w/ has dressing in place from facility. Pt has bruising to anterior L shoulder, presumably from a fall.

## 2017-05-20 NOTE — Progress Notes (Signed)
Patient arrived in ICU with agonal breathing, O2 saturation unable to read at mulitple sites, temperature 32.6 C with temperature foley, gray color, BP with SBP in 60s on dopamine at 15 mcg/kg/min, unable to track or follow commands with RN or NP at bedside. NP went out to speak with family after dopamine increased to 20 mcg/kg/min. Patient transitioned to comfort care per orders after family discussion, morphine one time order given before dopamine stopped. Family at bedside with personal chaplain and tearful.

## 2017-05-20 NOTE — Consult Note (Signed)
Pt arrived to ICU 2017/06/21 with severe septic shock with hypotension of unknown etiology requiring pressor therapy.  However, upon arrival to ICU pt having agonal respirations, unresponsive, hypotensive with systolic bp 60's despite 20 mcg/kg/min of continuous dopamine, and pt paced on cardiac monitor.  Due to poor prognosis and pt actively dying I spoke with pts sons and pts brothers regarding plan of care and they decided to transition pt to comfort measures only.  Therefore, comfort care orders placed nursing staff aware of plan of care.  Family currently at bedside.  Gregory [name], AGNP  Pulmonary/Critical Care Pager [phone] (please enter [phone] digits) PCCM Consult Pager [phone] (please enter [phone])   Gregory Fischeravid Adelia Baptista, MD PCCM service Mobile [phone] Pager [phone] 04-15-17 8:01 AM

## 2017-05-20 NOTE — ED Notes (Signed)
[place] applied for rewarming.

## 2017-05-20 NOTE — Procedures (Signed)
SURGICAL PROCEDURE REPORT  DATE OF PROCEDURE: May 02, 2017   ATTENDING SURGEON: Barbara CowerJason E. Earlene Plateravis, MD   ANESTHESIA: Local with light IV sedation   PRE-OPERATIVE DIAGNOSIS: Septic shock (icd-10: R65.21)  POST-OPERATIVE DIAGNOSIS: Septic shock (icd-10: R65.21)  PROCEDURE(S):  1.) Percutaneous access of Right internal jugular vein under ultrasound guidance 2.) Insertion of Right internal jugular triple-lumen central venous catheter (cpt: [id number])  INTRAOPERATIVE FINDINGS: Patent easily compressible Right internal jugular vein with appropriate respiratory variations and well-secured and flushed triple-lumen central venous catheter at completion of the procedure  INTRAOPERATIVE FLUIDS: 0 mL crystalloid, 0 mL contrast used   ESTIMATED BLOOD LOSS: Minimal (<20 mL)   SPECIMENS: None   IMPLANTS: Right internal jugular triple-lumen central venous catheter  DRAINS: None   COMPLICATIONS: None apparent   CONDITION AT COMPLETION: Hemodynamically stable, awake   DISPOSITION: Remaining in ED room same as pre-procedure, awaiting transfer to ICU  INDICATION(S) FOR PROCEDURE:  Patient is a 83 y.o. male who [facility] with acute onset of hypotension, hypothermia, and respiratory failure, attributed to HCAP with septic shock and complicated by AKI. Femoral central venous catheter insertion was attempted by ED physician, but was unsuccessful, and surgery was asked to perform insertion of central venous catheter. All risks, benefits, and alternatives to above elective procedures were discussed with the patient's family, who elected to proceed, and informed consent was accordingly obtained and documented at that time.  DETAILS OF PROCEDURE:  Patient was appropriately identified. In Trendelenburg position, Right IJ venous access site was prepped and draped in the usual sterile fashion, and following a brief timeout, limited duplex evaluation of Right internal jugular vein was performed.  Percutaneous Right IJ venous access was obtained under ultrasound guidance using Seldinger technique, by which local anesthetic was injected over the Right IJ vein, and access needle was inserted into the Right IJ vein, through which soft guidewire was advanced, over which access needle was withdrawn. Subcutaneous dilator was advanced over the guidewire and withdrawn, followed by insertion of triple-lumen central venous catheter. Skin was then cleaned, dried, and sterile occlusive dressing was applied. Post-procedure, patient remained in ED, awaiting ICU bed.   I was present for all aspects of the procedures, and there were no intraprocedural complications apparent.

## 2017-05-20 NOTE — Progress Notes (Signed)
Patient died at 11:45 with family and RN at bedside.

## 2017-05-20 DEATH — deceased

## 2017-07-21 NOTE — Discharge Summary (Signed)
DEATH SUMMARY  DATE OF ADMISSION:  Jul 19, 2017  DATE OF DISCHARGE/DEATH:  Jul 19, 2017  ADMISSION DIAGNOSES:   Severe sepsis Septic shock Congestive heart failure-combined diastolic and systolic Acute kidney injury Elevated liver function tests History of hypertension History of BPH   DISCHARGE DIAGNOSES:   Severe sepsis Septic shock Congestive heart failure-combined diastolic and systolic Acute kidney injury Elevated liver function tests History of hypertension History of BPH   PRESENTATION:   Pt was admitted by Dr. Sheryle Hailiamond of the hospitalist service with the following HPI and the above admission diagnoses: HPI: The patient with past medical history of CHF, hypertension and dementia presents to the emergency department with hypothermia. The patient was sent from his nursing home due to hypothymia and hypotension.  The patient had been started on doxycycline this week but had not received any in the last few days due to a faint rash that was presumed to be a drug reaction. However, the patient's sons state that he intermittently has this rash independent of antibiotic administration. In the emergency department he was found to have a core temperature of approximately 109F. The patient was also markedly hypotensive. Blood cultures were immediately obtained and the patient started on broad-spectrum antibiotics. Source of infection was not immediately clear. Following multiple saline boluses the patient's blood pressure remained low Central venous catheter placement was obtained and the right IJ. He was started on dopamine and levofloxacin prior to the hospitalist service being called for admission.  HOSPITAL COURSE:   He was made DNR in the emergency department. Upon arrival to the ICU he was noted to be in distress and the family requested comfort care. Comfort measures only were initiated and he passed away shortly thereafter.  Cause of death:  Septic shock  Contributing  factors: Congestive heart failure, acute kidney injury   Gregory Fischeravid Damascus Feldpausch, MD PCCM service Mobile [phone] Pager [phone] 06/20/2017 4:08 PM

## 2019-02-22 IMAGING — DX DG CHEST 1V PORT
1 series · 1 of 1 positions shown · non-contrast
Comparison: Prior radiograph from earlier the same day.

CLINICAL DATA: Initial evaluation for right IJ central line
placement.

EXAM:
PORTABLE CHEST 1 VIEW

[chest ap]
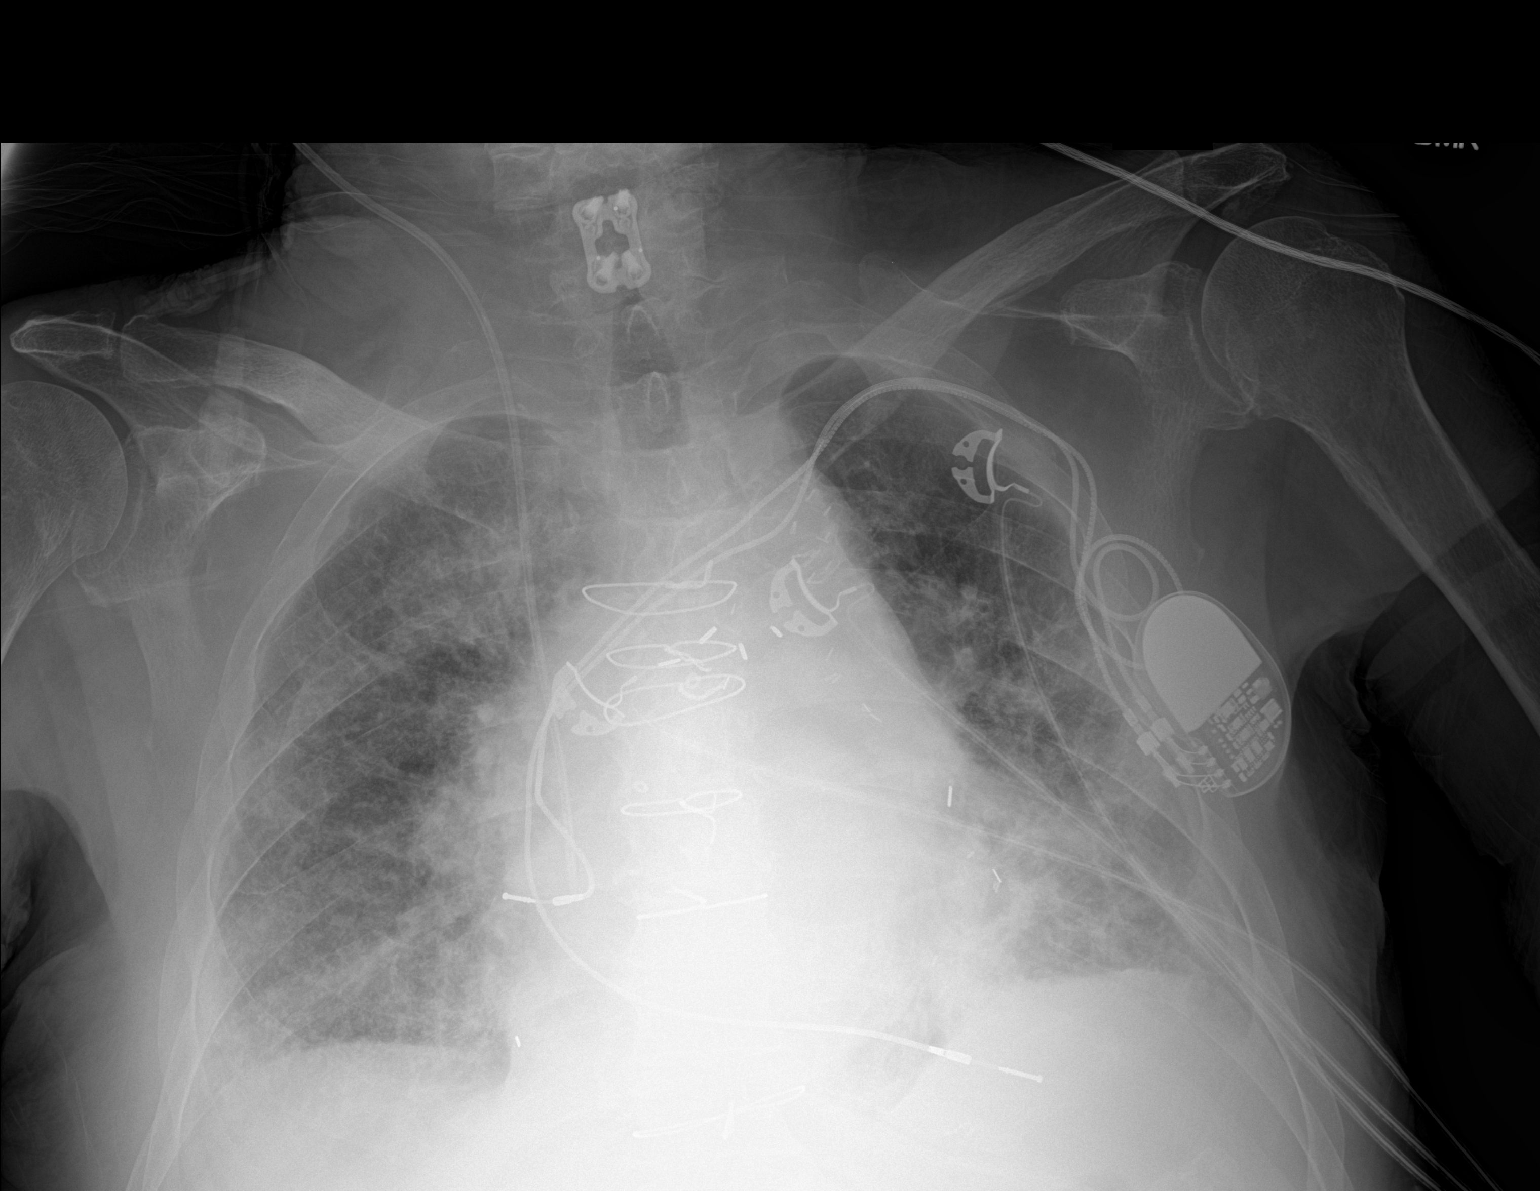

[1 of 1 positions shown; findings below may reference images not displayed]

FINDINGS: There has been interval placement of a right IJ approach central
venous catheter with tip overlying the left-sided pacemaker/AICD
noted. Median sternotomy wires underlying CABG markers and surgical
clips. Stable cardiomegaly. Mediastinal silhouette normal.

Diffuse pulmonary vascular congestion with interstitial prominence
as is slightly increased as compared to previous exam, suggesting
slightly worsened pulmonary interstitial edema. Superimposed coarse
parenchymal opacity at the left lung base relatively stable. No
other new focal airspace disease. No pneumothorax status post
central line placement.

Osseous structures unchanged.
IMPRESSION: 1. Interval placement of right IJ approach central venous catheter
with tip overlying the proximal-mid right atrium.
2. Slightly worsened pulmonary vascular congestion and interstitial
prominence as compared to prior radiograph from earlier the same
day, suggesting worsened pulmonary interstitial edema.
3. Stable parenchymal left basilar opacity.
